# Patient Record
Sex: Female | Born: 1975
Health system: Southern US, Community
[De-identification: ages and names within clinical notes are randomized; demographics above are authoritative.]

## PROBLEM LIST (undated history)

## (undated) DIAGNOSIS — R519 Headache, unspecified: Secondary | ICD-10-CM

## (undated) DIAGNOSIS — E785 Hyperlipidemia, unspecified: Secondary | ICD-10-CM

## (undated) DIAGNOSIS — I10 Essential (primary) hypertension: Secondary | ICD-10-CM

## (undated) DIAGNOSIS — F329 Major depressive disorder, single episode, unspecified: Secondary | ICD-10-CM

## (undated) DIAGNOSIS — F419 Anxiety disorder, unspecified: Secondary | ICD-10-CM

## (undated) DIAGNOSIS — G4730 Sleep apnea, unspecified: Secondary | ICD-10-CM

## (undated) DIAGNOSIS — K219 Gastro-esophageal reflux disease without esophagitis: Secondary | ICD-10-CM

## (undated) DIAGNOSIS — M7918 Myalgia, other site: Secondary | ICD-10-CM

## (undated) DIAGNOSIS — F32A Depression, unspecified: Secondary | ICD-10-CM

## (undated) HISTORY — PX: CHOLECYSTECTOMY: SHX55

## (undated) HISTORY — DX: Essential (primary) hypertension: I10

## (undated) HISTORY — PX: TUBAL LIGATION: SHX77

## (undated) HISTORY — DX: Sleep apnea, unspecified: G47.30

---

## 2006-07-25 ENCOUNTER — Observation Stay: Payer: Self-pay | Admitting: Obstetrics and Gynecology

## 2006-08-29 ENCOUNTER — Ambulatory Visit: Payer: Self-pay

## 2006-09-13 ENCOUNTER — Observation Stay: Payer: Self-pay

## 2006-10-06 ENCOUNTER — Observation Stay: Payer: Self-pay

## 2006-10-27 ENCOUNTER — Ambulatory Visit: Payer: Self-pay

## 2006-10-28 ENCOUNTER — Inpatient Hospital Stay: Payer: Self-pay

## 2014-07-28 ENCOUNTER — Emergency Department
Admit: 2014-07-28 | Disposition: A | Discharge status: Home / Self Care | Payer: Self-pay | Admitting: Emergency Medicine

## 2015-08-28 ENCOUNTER — Emergency Department
Admission: EM | Admit: 2015-08-28 | Discharge: 2015-08-28 | Disposition: A | Discharge status: Home / Self Care | Payer: No Typology Code available for payment source | Attending: Emergency Medicine | Admitting: Emergency Medicine

## 2015-08-28 ENCOUNTER — Emergency Department: Payer: No Typology Code available for payment source

## 2015-08-28 ENCOUNTER — Encounter: Payer: Self-pay | Admitting: Intensive Care

## 2015-08-28 DIAGNOSIS — Y9389 Activity, other specified: Secondary | ICD-10-CM | POA: Diagnosis not present

## 2015-08-28 DIAGNOSIS — T148XXA Other injury of unspecified body region, initial encounter: Secondary | ICD-10-CM

## 2015-08-28 DIAGNOSIS — Y92410 Unspecified street and highway as the place of occurrence of the external cause: Secondary | ICD-10-CM | POA: Diagnosis not present

## 2015-08-28 DIAGNOSIS — Y999 Unspecified external cause status: Secondary | ICD-10-CM | POA: Diagnosis not present

## 2015-08-28 DIAGNOSIS — F329 Major depressive disorder, single episode, unspecified: Secondary | ICD-10-CM | POA: Diagnosis not present

## 2015-08-28 DIAGNOSIS — S1093XA Contusion of unspecified part of neck, initial encounter: Secondary | ICD-10-CM | POA: Diagnosis not present

## 2015-08-28 DIAGNOSIS — E785 Hyperlipidemia, unspecified: Secondary | ICD-10-CM | POA: Diagnosis not present

## 2015-08-28 DIAGNOSIS — S169XXA Unspecified injury of muscle, fascia and tendon at neck level, initial encounter: Secondary | ICD-10-CM | POA: Diagnosis present

## 2015-08-28 HISTORY — DX: Hyperlipidemia, unspecified: E78.5

## 2015-08-28 HISTORY — DX: Myalgia, other site: M79.18

## 2015-08-28 HISTORY — DX: Gastro-esophageal reflux disease without esophagitis: K21.9

## 2015-08-28 HISTORY — DX: Anxiety disorder, unspecified: F41.9

## 2015-08-28 HISTORY — DX: Major depressive disorder, single episode, unspecified: F32.9

## 2015-08-28 HISTORY — DX: Depression, unspecified: F32.A

## 2015-08-28 LAB — COMPREHENSIVE METABOLIC PANEL
ALT: 23 U/L (ref 14–54)
AST: 22 U/L (ref 15–41)
Albumin: 3.9 g/dL (ref 3.5–5.0)
Alkaline Phosphatase: 81 U/L (ref 38–126)
Anion gap: 6 (ref 5–15)
BUN: 12 mg/dL (ref 6–20)
CO2: 27 mmol/L (ref 22–32)
Calcium: 8.9 mg/dL (ref 8.9–10.3)
Chloride: 105 mmol/L (ref 101–111)
Creatinine, Ser: 0.85 mg/dL (ref 0.44–1.00)
GFR calc Af Amer: >60 mL/min (ref >60)
GFR calc non Af Amer: >60 mL/min (ref >60)
Glucose, Bld: 104 mg/dL — ABNORMAL HIGH (ref 65–99)
Potassium: 4.5 mmol/L (ref 3.5–5.1)
Sodium: 138 mmol/L (ref 135–145)
Total Bilirubin: <0.1 mg/dL — ABNORMAL LOW (ref 0.3–1.2)
Total Protein: 7.4 g/dL (ref 6.5–8.1)

## 2015-08-28 LAB — CBC WITH DIFFERENTIAL/PLATELET
BASOS ABS: 0 10*3/uL (ref 0–0.1)
Basophils Relative: 1 %
EOS PCT: 3 %
Eosinophils Absolute: 0.2 10*3/uL (ref 0–0.7)
HCT: 36.9 % (ref 35.0–47.0)
Hemoglobin: 12.3 g/dL (ref 12.0–16.0)
LYMPHS PCT: 28 %
Lymphs Abs: 1.7 10*3/uL (ref 1.0–3.6)
MCH: 27.9 pg (ref 26.0–34.0)
MCHC: 33.4 g/dL (ref 32.0–36.0)
MCV: 83.5 fL (ref 80.0–100.0)
Monocytes Absolute: 0.4 10*3/uL (ref 0.2–0.9)
Monocytes Relative: 6 %
NEUTROS ABS: 3.9 10*3/uL (ref 1.4–6.5)
Neutrophils Relative %: 62 %
PLATELETS: 232 10*3/uL (ref 150–440)
RBC: 4.42 MIL/uL (ref 3.80–5.20)
RDW: 13.7 % (ref 11.5–14.5)
WBC: 6.2 10*3/uL (ref 3.6–11.0)

## 2015-08-28 LAB — LIPASE, BLOOD: LIPASE: 27 U/L (ref 11–51)

## 2015-08-28 MED ORDER — OXYCODONE-ACETAMINOPHEN 5-325 MG PO TABS
1.0000 | ORAL_TABLET | Freq: Once | ORAL | Status: AC
Start: 2015-08-28 — End: 2015-08-28
  Administered 2015-08-28: 1 via ORAL

## 2015-08-28 MED ORDER — OXYCODONE-ACETAMINOPHEN 5-325 MG PO TABS
ORAL_TABLET | ORAL | Status: AC
  Administered 2015-08-28: 1 via ORAL
  Filled 2015-08-28: qty 1

## 2015-08-28 MED ORDER — IOPAMIDOL (ISOVUE-300) INJECTION 61%
100.0000 mL | Freq: Once | INTRAVENOUS | Status: AC | PRN
  Administered 2015-08-28: 100 mL via INTRAVENOUS

## 2015-08-28 MED ORDER — SODIUM CHLORIDE 0.9 % IV BOLUS (SEPSIS)
1000.0000 mL | Freq: Once | INTRAVENOUS | Status: AC
  Administered 2015-08-28: 1000 mL via INTRAVENOUS

## 2015-08-28 NOTE — Discharge Instructions (Signed)
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
## 2019-02-05 ENCOUNTER — Ambulatory Visit (INDEPENDENT_AMBULATORY_CARE_PROVIDER_SITE_OTHER): Payer: 59 | Admitting: Physician Assistant

## 2019-02-05 ENCOUNTER — Other Ambulatory Visit: Payer: Self-pay

## 2019-02-05 ENCOUNTER — Encounter: Payer: Self-pay | Admitting: Physician Assistant

## 2019-02-05 VITALS — BP 137/89 | HR 85 | Ht 67.0 in | Wt 264.0 lb

## 2019-02-05 DIAGNOSIS — G4440 Drug-induced headache, not elsewhere classified, not intractable: Secondary | ICD-10-CM | POA: Diagnosis not present

## 2019-02-05 DIAGNOSIS — G43009 Migraine without aura, not intractable, without status migrainosus: Secondary | ICD-10-CM

## 2019-02-05 MED ORDER — CYCLOBENZAPRINE HCL 10 MG PO TABS: 10.0000 mg | ORAL_TABLET | Freq: Three times a day (TID) | ORAL | 2 refills | Status: DC | PRN

## 2019-02-05 MED ORDER — METOCLOPRAMIDE HCL 10 MG PO TABS: 10.0000 mg | ORAL_TABLET | Freq: Three times a day (TID) | ORAL | 1 refills | Status: DC | PRN

## 2019-02-05 MED ORDER — TOPIRAMATE 25 MG PO TABS: 75.0000 mg | ORAL_TABLET | Freq: Every day | ORAL | 3 refills | Status: DC

## 2019-02-05 MED ORDER — UBRELVY 50 MG PO TABS: 50.0000 mg | ORAL_TABLET | ORAL | 3 refills | Status: DC | PRN

## 2019-02-05 NOTE — Patient Instructions (Signed)
Migraine Headache A migraine headache is a very strong throbbing pain on one side or both sides of your head. This type of headache can also cause other symptoms. It can last from 4 hours to 3 days. Talk with your doctor about what things may bring on (trigger) this condition. What are the causes? The exact cause of this condition is not known. This condition may be triggered or caused by:  Drinking alcohol.  Smoking.  Taking medicines, such as: ? Medicine used to treat chest pain (nitroglycerin). ? Birth control pills. ? Estrogen. ? Some blood pressure medicines.  Eating or drinking certain products.  Doing physical activity. Other things that may trigger a migraine headache include:  Having a menstrual period.  Pregnancy.  Hunger.  Stress.  Not getting enough sleep or getting too much sleep.  Weather changes.  Tiredness (fatigue). What increases the risk?  Being 25-55 years old.  Being female.  Having a family history of migraine headaches.  Being Caucasian.  Having depression or anxiety.  Being very overweight. What are the signs or symptoms?  A throbbing pain. This pain may: ? Happen in any area of the head, such as on one side or both sides. ? Make it hard to do daily activities. ? Get worse with physical activity. ? Get worse around bright lights or loud noises.  Other symptoms may include: ? Feeling sick to your stomach (nauseous). ? Vomiting. ? Dizziness. ? Being sensitive to bright lights, loud noises, or smells.  Before you get a migraine headache, you may get warning signs (an aura). An aura may include: ? Seeing flashing lights or having blind spots. ? Seeing bright spots, halos, or zigzag lines. ? Having tunnel vision or blurred vision. ? Having numbness or a tingling feeling. ? Having trouble talking. ? Having weak muscles.  Some people have symptoms after a migraine headache (postdromal phase), such as: ? Tiredness. ? Trouble  thinking (concentrating). How is this treated?  Taking medicines that: ? Relieve pain. ? Relieve the feeling of being sick to your stomach. ? Prevent migraine headaches.  Treatment may also include: ? Having acupuncture. ? Avoiding foods that bring on migraine headaches. ? Learning ways to control your body functions (biofeedback). ? Therapy to help you know and deal with negative thoughts (cognitive behavioral therapy). Follow these instructions at home: Medicines  Take over-the-counter and prescription medicines only as told by your doctor.  Ask your doctor if the medicine prescribed to you: ? Requires you to avoid driving or using heavy machinery. ? Can cause trouble pooping (constipation). You may need to take these steps to prevent or treat trouble pooping:  Drink enough fluid to keep your pee (urine) pale yellow.  Take over-the-counter or prescription medicines.  Eat foods that are high in fiber. These include beans, whole grains, and fresh fruits and vegetables.  Limit foods that are high in fat and sugar. These include fried or sweet foods. Lifestyle  Do not drink alcohol.  Do not use any products that contain nicotine or tobacco, such as cigarettes, e-cigarettes, and chewing tobacco. If you need help quitting, ask your doctor.  Get at least 8 hours of sleep every night.  Limit and deal with stress. General instructions      Keep a journal to find out what may bring on your migraine headaches. For example, write down: ? What you eat and drink. ? How much sleep you get. ? Any change in what you eat or drink. ? Any   change in your medicines.  If you have a migraine headache: ? Avoid things that make your symptoms worse, such as bright lights. ? It may help to lie down in a dark, quiet room. ? Do not drive or use heavy machinery. ? Ask your doctor what activities are safe for you.  Keep all follow-up visits as told by your doctor. This is important. Contact  a doctor if:  You get a migraine headache that is different or worse than others you have had.  You have more than 15 headache days in one month. Get help right away if:  Your migraine headache gets very bad.  Your migraine headache lasts longer than 72 hours.  You have a fever.  You have a stiff neck.  You have trouble seeing.  Your muscles feel weak or like you cannot control them.  You start to lose your balance a lot.  You start to have trouble walking.  You pass out (faint).  You have a seizure. Summary  A migraine headache is a very strong throbbing pain on one side or both sides of your head. These headaches can also cause other symptoms.  This condition may be treated with medicines and changes to your lifestyle.  Keep a journal to find out what may bring on your migraine headaches.  Contact a doctor if you get a migraine headache that is different or worse than others you have had.  Contact your doctor if you have more than 15 headache days in a month. This information is not intended to replace advice given to you by your health care provider. Make sure you discuss any questions you have with your health care provider. Document Released: 01/16/2008 Document Revised: 07/31/2018 Document Reviewed: 05/21/2018 Elsevier Patient Education  2020 [employer].   Analgesic Rebound Headache An analgesic rebound headache, sometimes called a medication overuse headache, is a headache that comes after pain medicine (analgesic) taken to treat the original (primary) headache has worn off. Any type of primary headache can return as a rebound headache if a person regularly takes analgesics more than three times a week to treat it. The types of primary headaches that are commonly associated with rebound headaches include:  Migraines.  Headaches that arise from tense muscles in the head and neck area (tension headaches).  Headaches that develop and happen again (recur) on one  side of the head and around the eye (cluster headaches). If rebound headaches continue, they become chronic daily headaches. What are the causes? This condition may be caused by frequent use of:  Over-the-counter medicines such as aspirin, ibuprofen, and acetaminophen.  Sinus relief medicines and other medicines that contain caffeine.  Narcotic pain medicines such as codeine and oxycodone. What are the signs or symptoms? The symptoms of a rebound headache are the same as the symptoms of the original headache. Some of the symptoms of specific types of headaches include: Migraine headache  Pulsing or throbbing pain on one or both sides of the head.  Severe pain that interferes with daily activities.  Pain that is worsened by physical activity.  Nausea, vomiting, or both.  Pain with exposure to bright light, loud noises, or strong smells.  General sensitivity to bright light, loud noises, or strong smells.  Visual changes.  Numbness of one or both arms. Tension headache  Pressure around the head.  Dull, aching head pain.  Pain felt over the front and sides of the head.  Tenderness in the muscles of the head, neck,  and shoulders. Cluster headache  Severe pain that begins in or around one eye or temple.  Redness and tearing in the eye on the same side as the pain.  Droopy or swollen eyelid.  One-sided head pain.  Nausea.  Runny nose.  Sweaty, pale facial skin.  Restlessness. How is this diagnosed? This condition is diagnosed by:  Reviewing your medical history. This includes the nature of your primary headaches.  Reviewing the types of pain medicines that you have been using to treat your headaches and how often you take them. How is this treated? This condition may be treated or managed by:  Discontinuing frequent use of the analgesic medicine. Doing this may worsen your headaches at first, but the pain should eventually become more manageable, less  frequent, and less severe.  Seeing a headache specialist. He or she may be able to help you manage your headaches and help make sure there is not another cause of the headaches.  Using methods of stress relief, such as acupuncture, counseling, biofeedback, and massage. Talk with your health care provider about which methods might be good for you. Follow these instructions at home:  Take over-the-counter and prescription medicines only as told by your health care provider.  Stop the repeated use of pain medicine as told by your health care provider. Stopping can be difficult. Carefully follow instructions from your health care provider.  Avoid triggers that are known to cause your primary headaches.  Keep all follow-up visits as told by your health care provider. This is important. Contact a health care provider if:  You continue to experience headaches after following treatments that your health care provider recommended. Get help right away if:  You develop new headache pain.  You develop headache pain that is different than what you have experienced in the past.  You develop numbness or tingling in your arms or legs.  You develop changes in your speech or vision. This information is not intended to replace advice given to you by your health care provider. Make sure you discuss any questions you have with your health care provider. Document Released: 06/29/2003 Document Revised: 03/21/2017 Document Reviewed: 09/11/2015 Elsevier Patient Education  2020 [employer].

## 2019-02-05 NOTE — Progress Notes (Signed)
History:  Kelly [name] is a 43 y.o.  who presents to clinic today for new evaluation of headache.   She has been getting headaches over 20 years.  The headaches get to be severe.  They are located right side - forehead/eye/temple.  This week she also had sharp shooting pain into her jaw.  There is throbbing.  Movement makes it worse as well as lights.  Noises are less an issue.  Some smells cause issues.  There is nausea, dizziness.   There is pressure in the eye area first.  Last week, it lasted 6 days straight, normally it is less.   She has tried excedrin, hydroxyzine, tylenol, caffeine, ibuprofen, aleve, cold compress, rest.   Her periods are irregular, which is new for her. 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 She drinks 6 sodas (Pepsi) daily.  HIT6:62 Number of days in the last 4 weeks with:  Severe headache: 3 Moderate headache: 1 Mild headache: 6  No headache: 18   Past Medical History:  Diagnosis Date  . Anxiety   . Depression   . GERD (gastroesophageal reflux disease)   . Hyperlipemia   . Myofascial pain syndrome     Social History   Socioeconomic History  . Marital status: Married    Spouse name: Not on file  . Number of children: Not on file  . Years of education: Not on file  . Highest education level: Not on file  Occupational History  . Not on file  Social Needs  . Financial resource strain: Not on file  . Food insecurity    Worry: Not on file    Inability: Not on file  . Transportation needs    Medical: Not on file    Non-medical: Not on file  Tobacco Use  . Smoking status: Never Smoker  . Smokeless tobacco: Never Used  Substance and Sexual Activity  . Alcohol use: No  . Drug use: No  . Sexual activity: Not on file  Lifestyle  . Physical activity    Days per week: Not on file    Minutes per session: Not on file  . Stress: Not on file  Relationships  . Social  Herbalist on phone: Not on file    Gets together: Not on file    Attends religious service: Not on file    Active member of club or organization: Not on file    Attends meetings of clubs or organizations: Not on file    Relationship status: Not on file  . Intimate partner violence    Fear of current or ex partner: Not on file    Emotionally abused: Not on file    Physically abused: Not on file    Forced sexual activity: Not on file  Other Topics Concern  . Not on file  Social History Narrative  . Not on file    History reviewed. No pertinent family history.  No Known Allergies  Current Outpatient Medications on File Prior to Visit  Medication Sig Dispense Refill  . atorvastatin (LIPITOR) 40 MG tablet Take 40 mg by mouth daily.    Marland Kitchen buPROPion (WELLBUTRIN SR) 150 MG 12 hr tablet Take 150 mg by mouth 2 (two) times daily.    Marland Kitchen escitalopram (LEXAPRO) 20 MG tablet Take 20 mg by mouth daily.    . hydrOXYzine (VISTARIL) 25 MG  capsule Take 25 mg by mouth 3 (three) times daily as needed.    Marland Kitchen omeprazole (PRILOSEC) 20 MG capsule Take 20 mg by mouth daily.     No current facility-administered medications on file prior to visit.      Review of Systems:  All pertinent positive/negative included in HPI, all other review of systems are negative   Objective:  Physical Exam BP 137/89   Pulse 85   Ht 5\' 7"  (1.702 m)   Wt 264 lb (119.7 kg)   BMI 41.35 kg/m  CONSTITUTIONAL: Well-developed, well-nourished female in no acute distress.  EYES: EOM intact ENT: Normocephalic CARDIOVASCULAR: Regular rate  RESPIRATORY: Normal rate. MUSCULOSKELETAL: Normal ROM SKIN: Warm, dry without erythema  NEUROLOGICAL: Alert, oriented, CN II-XII grossly intact, Appropriate balance PSYCH: Normal behavior, mood   Assessment & Plan:  Assessment: Migraine - without aura, new diagnosis Medication overuse headache  Plan: Try to cut out Pepsi/all soda Begin Topamax for migraine prevention -  start with 25mg  daily and titrate to 75mg  daily as tolerated.   Exercise - am - outside Yoga/meditation/massage - all recommended Flexeril - for HA prn  - sedation precautions Ubrelvy for migraine - sample and info provided Reglan for HA/Nausea Detailed instructions written for pt.   Follow-up in 3 months or sooner PRN  , PA-C 02/05/2019 10:18 AM

## 2019-02-10 ENCOUNTER — Encounter: Payer: Self-pay | Admitting: *Deleted

## 2019-04-30 ENCOUNTER — Other Ambulatory Visit: Payer: Self-pay

## 2019-04-30 ENCOUNTER — Ambulatory Visit (INDEPENDENT_AMBULATORY_CARE_PROVIDER_SITE_OTHER): Payer: 59 | Admitting: Physician Assistant

## 2019-04-30 ENCOUNTER — Encounter: Payer: Self-pay | Admitting: Physician Assistant

## 2019-04-30 VITALS — BP 146/90 | HR 99 | Wt 268.0 lb

## 2019-04-30 DIAGNOSIS — G43709 Chronic migraine without aura, not intractable, without status migrainosus: Secondary | ICD-10-CM | POA: Diagnosis not present

## 2019-04-30 DIAGNOSIS — G43009 Migraine without aura, not intractable, without status migrainosus: Secondary | ICD-10-CM

## 2019-04-30 MED ORDER — NURTEC 75 MG PO TBDP: 75.0000 mg | ORAL_TABLET | ORAL | 6 refills | Status: DC | PRN

## 2019-04-30 MED ORDER — TOPIRAMATE 100 MG PO TABS: 100.0000 mg | ORAL_TABLET | Freq: Two times a day (BID) | ORAL | 3 refills | Status: DC

## 2019-04-30 MED ORDER — METOPROLOL TARTRATE 25 MG PO TABS: 25.0000 mg | ORAL_TABLET | Freq: Two times a day (BID) | ORAL | 3 refills | Status: DC

## 2019-04-30 NOTE — Patient Instructions (Signed)

## 2019-04-30 NOTE — Progress Notes (Signed)
History:  Kelly [name] is a 44 y.o. who presents to clinic today for headache management.  In the beginning the topamax helped to ease the frequency of the migraine.  She is using 3/day without side effects.  She currently notices no improvement whatsoever and is having headaches every single day, often severe.   Stress has increased tremendously at work.  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   Bernita Raisin was helpful but takes an hour and a half to notice any improvement.    HIT6:66 Number of days in the last 4 weeks with:  Severe headache: 7 Moderate headache: 14 Mild headache: 7  No headache: 0   Past Medical History:  Diagnosis Date  . Anxiety   . Depression   . GERD (gastroesophageal reflux disease)   . Hyperlipemia   . Myofascial pain syndrome     Social History   Socioeconomic History  . Marital status: Single    Spouse name: Not on file  . Number of children: Not on file  . Years of education: Not on file  . Highest education level: Not on file  Occupational History  . Not on file  Tobacco Use  . Smoking status: Never Smoker  . Smokeless tobacco: Never Used  Substance and Sexual Activity  . Alcohol use: No  . Drug use: No  . Sexual activity: Not on file  Other Topics Concern  . Not on file  Social History Narrative  . Not on file   Social Determinants of Health   Financial Resource Strain:   . Difficulty of Paying Living Expenses: Not on file  Food Insecurity:   . Worried About Programme researcher, broadcasting/film/video in the Last Year: Not on file  . Ran Out of Food in the Last Year: Not on file  Transportation Needs:   . Lack of Transportation (Medical): Not on file  . Lack of Transportation (Non-Medical): Not on file  Physical Activity:   . Days of Exercise per Week: Not on file  . Minutes of Exercise per Session: Not on file  Stress:   . Feeling of Stress : Not on file  Social Connections:   . Frequency of Communication with Friends and Family: Not on  file  . Frequency of Social Gatherings with Friends and Family: Not on file  . Attends Religious Services: Not on file  . Active Member of Clubs or Organizations: Not on file  . Attends Banker Meetings: Not on file  . Marital Status: Not on file  Intimate Partner Violence:   . Fear of Current or Ex-Partner: Not on file  . Emotionally Abused: Not on file  . Physically Abused: Not on file  . Sexually Abused: Not on file    History reviewed. No pertinent family history.  No Known Allergies  Current Outpatient Medications on File Prior to Visit  Medication Sig Dispense Refill  . atorvastatin (LIPITOR) 40 MG tablet Take 40 mg by mouth daily.    Marland Kitchen buPROPion (WELLBUTRIN SR) 150 MG 12 hr tablet Take 150 mg by mouth 2 (two) times daily.    . cyclobenzaprine (FLEXERIL) 10 MG tablet Take 1 tablet (10 mg total) by mouth 3 (three) times daily as needed for muscle spasms. 40 tablet 2  . escitalopram (LEXAPRO) 20 MG tablet Take 20 mg by mouth daily.    . hydrOXYzine (VISTARIL) 25 MG capsule Take 25 mg by mouth 3 (three) times daily as needed.    . metoCLOPramide (  REGLAN) 10 MG tablet Take 1 tablet (10 mg total) by mouth 3 (three) times daily as needed for nausea. 30 tablet 1  . omeprazole (PRILOSEC) 20 MG capsule Take 20 mg by mouth daily.    Marland Kitchen topiramate (TOPAMAX) 25 MG tablet Take 3 tablets (75 mg total) by mouth daily. 90 tablet 3  . Ubrogepant (UBRELVY) 50 MG TABS Take 50 mg by mouth as needed. 10 tablet 3   No current facility-administered medications on file prior to visit.     Review of Systems:  All pertinent positive/negative included in HPI, all other review of systems are negative   Objective:  Physical Exam BP (!) 146/90   Pulse 99   Wt 268 lb (121.6 kg)   BMI 41.97 kg/m  CONSTITUTIONAL: Well-developed, well-nourished female in no acute distress.  EYES: EOM intact ENT: Normocephalic CARDIOVASCULAR: Regular rate  RESPIRATORY: Normal rate.  MUSCULOSKELETAL:  Normal ROM SKIN: Warm, dry without erythema  NEUROLOGICAL: Alert, oriented, CN II-XII grossly intact, Appropriate balance PSYCH: Normal behavior, mood   Assessment & Plan:  Assessment: Insufficient improvement in migraine Chronic Migraine - new problem  Plan: Increase Topamax as tolerated up to 200mg  daily Begin Metoprolol for migraine prevention as well - 25mg  daily and may increase to 2/day as tolerated and as needed Will trial Nurtec in place of Ubrelvy in hopes of faster efficacy Follow-up in 2 months or sooner PRN  Jaclyn Prime, Collene Leyden, PA-C 04/30/2019 10:09 AM

## 2019-05-10 ENCOUNTER — Encounter: Payer: Self-pay | Admitting: *Deleted

## 2019-10-20 ENCOUNTER — Encounter: Payer: Self-pay | Admitting: *Deleted

## 2019-10-26 ENCOUNTER — Other Ambulatory Visit: Payer: Self-pay | Admitting: Physician Assistant

## 2019-11-03 ENCOUNTER — Other Ambulatory Visit: Payer: Self-pay

## 2019-11-03 ENCOUNTER — Other Ambulatory Visit: Payer: Self-pay | Admitting: Physician Assistant

## 2019-11-03 NOTE — Telephone Encounter (Signed)
Patient is requesting a refill on lopressor and topamax

## 2019-11-10 DIAGNOSIS — F331 Major depressive disorder, recurrent, moderate: Secondary | ICD-10-CM | POA: Diagnosis not present

## 2019-11-10 DIAGNOSIS — F411 Generalized anxiety disorder: Secondary | ICD-10-CM | POA: Diagnosis not present

## 2019-11-24 DIAGNOSIS — F4011 Social phobia, generalized: Secondary | ICD-10-CM | POA: Diagnosis not present

## 2019-11-24 DIAGNOSIS — F331 Major depressive disorder, recurrent, moderate: Secondary | ICD-10-CM | POA: Diagnosis not present

## 2019-11-24 DIAGNOSIS — F411 Generalized anxiety disorder: Secondary | ICD-10-CM | POA: Diagnosis not present

## 2019-12-08 DIAGNOSIS — F411 Generalized anxiety disorder: Secondary | ICD-10-CM | POA: Diagnosis not present

## 2019-12-15 DIAGNOSIS — F331 Major depressive disorder, recurrent, moderate: Secondary | ICD-10-CM | POA: Diagnosis not present

## 2019-12-15 DIAGNOSIS — F411 Generalized anxiety disorder: Secondary | ICD-10-CM | POA: Diagnosis not present

## 2019-12-25 DIAGNOSIS — F411 Generalized anxiety disorder: Secondary | ICD-10-CM | POA: Diagnosis not present

## 2020-01-05 DIAGNOSIS — F411 Generalized anxiety disorder: Secondary | ICD-10-CM | POA: Diagnosis not present

## 2020-01-12 DIAGNOSIS — Z124 Encounter for screening for malignant neoplasm of cervix: Secondary | ICD-10-CM | POA: Diagnosis not present

## 2020-01-12 DIAGNOSIS — R7989 Other specified abnormal findings of blood chemistry: Secondary | ICD-10-CM | POA: Diagnosis not present

## 2020-01-12 DIAGNOSIS — Z1389 Encounter for screening for other disorder: Secondary | ICD-10-CM | POA: Diagnosis not present

## 2020-01-12 DIAGNOSIS — R7309 Other abnormal glucose: Secondary | ICD-10-CM | POA: Diagnosis not present

## 2020-01-12 DIAGNOSIS — F339 Major depressive disorder, recurrent, unspecified: Secondary | ICD-10-CM | POA: Diagnosis not present

## 2020-01-12 DIAGNOSIS — Z1159 Encounter for screening for other viral diseases: Secondary | ICD-10-CM | POA: Diagnosis not present

## 2020-01-12 DIAGNOSIS — E559 Vitamin D deficiency, unspecified: Secondary | ICD-10-CM | POA: Diagnosis not present

## 2020-01-12 DIAGNOSIS — I10 Essential (primary) hypertension: Secondary | ICD-10-CM | POA: Diagnosis not present

## 2020-01-12 DIAGNOSIS — Z113 Encounter for screening for infections with a predominantly sexual mode of transmission: Secondary | ICD-10-CM | POA: Diagnosis not present

## 2020-01-12 DIAGNOSIS — E785 Hyperlipidemia, unspecified: Secondary | ICD-10-CM | POA: Diagnosis not present

## 2020-01-12 DIAGNOSIS — F419 Anxiety disorder, unspecified: Secondary | ICD-10-CM | POA: Diagnosis not present

## 2020-01-13 DIAGNOSIS — F331 Major depressive disorder, recurrent, moderate: Secondary | ICD-10-CM | POA: Diagnosis not present

## 2020-01-13 DIAGNOSIS — F411 Generalized anxiety disorder: Secondary | ICD-10-CM | POA: Diagnosis not present

## 2020-01-19 DIAGNOSIS — F411 Generalized anxiety disorder: Secondary | ICD-10-CM | POA: Diagnosis not present

## 2020-01-26 DIAGNOSIS — F411 Generalized anxiety disorder: Secondary | ICD-10-CM | POA: Diagnosis not present

## 2020-01-26 DIAGNOSIS — F331 Major depressive disorder, recurrent, moderate: Secondary | ICD-10-CM | POA: Diagnosis not present

## 2020-02-02 DIAGNOSIS — F4011 Social phobia, generalized: Secondary | ICD-10-CM | POA: Diagnosis not present

## 2020-02-02 DIAGNOSIS — F411 Generalized anxiety disorder: Secondary | ICD-10-CM | POA: Diagnosis not present

## 2020-02-02 DIAGNOSIS — F331 Major depressive disorder, recurrent, moderate: Secondary | ICD-10-CM | POA: Diagnosis not present

## 2020-02-09 DIAGNOSIS — F411 Generalized anxiety disorder: Secondary | ICD-10-CM | POA: Diagnosis not present

## 2020-03-07 DIAGNOSIS — Z1389 Encounter for screening for other disorder: Secondary | ICD-10-CM | POA: Diagnosis not present

## 2020-03-07 DIAGNOSIS — F331 Major depressive disorder, recurrent, moderate: Secondary | ICD-10-CM | POA: Diagnosis not present

## 2020-03-07 DIAGNOSIS — F339 Major depressive disorder, recurrent, unspecified: Secondary | ICD-10-CM | POA: Diagnosis not present

## 2020-03-07 DIAGNOSIS — F419 Anxiety disorder, unspecified: Secondary | ICD-10-CM | POA: Diagnosis not present

## 2020-03-08 DIAGNOSIS — F411 Generalized anxiety disorder: Secondary | ICD-10-CM | POA: Diagnosis not present

## 2020-03-22 DIAGNOSIS — F411 Generalized anxiety disorder: Secondary | ICD-10-CM | POA: Diagnosis not present

## 2020-03-30 ENCOUNTER — Other Ambulatory Visit: Payer: Self-pay | Admitting: Physician Assistant

## 2020-04-05 DIAGNOSIS — F411 Generalized anxiety disorder: Secondary | ICD-10-CM | POA: Diagnosis not present

## 2020-04-07 ENCOUNTER — Other Ambulatory Visit: Payer: Self-pay | Admitting: Physician Assistant

## 2020-04-07 ENCOUNTER — Encounter: Payer: Self-pay | Admitting: *Deleted

## 2020-04-27 DIAGNOSIS — F411 Generalized anxiety disorder: Secondary | ICD-10-CM | POA: Diagnosis not present

## 2020-04-28 ENCOUNTER — Other Ambulatory Visit: Payer: Self-pay | Admitting: Oral Surgery

## 2020-05-18 DIAGNOSIS — F411 Generalized anxiety disorder: Secondary | ICD-10-CM | POA: Diagnosis not present

## 2020-05-19 DIAGNOSIS — Z20822 Contact with and (suspected) exposure to covid-19: Secondary | ICD-10-CM | POA: Diagnosis not present

## 2020-05-29 DIAGNOSIS — F411 Generalized anxiety disorder: Secondary | ICD-10-CM | POA: Diagnosis not present

## 2020-06-02 DIAGNOSIS — U071 COVID-19: Secondary | ICD-10-CM | POA: Diagnosis not present

## 2020-06-02 DIAGNOSIS — Z20822 Contact with and (suspected) exposure to covid-19: Secondary | ICD-10-CM | POA: Diagnosis not present

## 2020-06-12 DIAGNOSIS — F411 Generalized anxiety disorder: Secondary | ICD-10-CM | POA: Diagnosis not present

## 2020-06-16 ENCOUNTER — Encounter: Payer: 59 | Admitting: Physician Assistant

## 2020-06-21 DIAGNOSIS — Z79899 Other long term (current) drug therapy: Secondary | ICD-10-CM | POA: Diagnosis not present

## 2020-06-21 DIAGNOSIS — Z1389 Encounter for screening for other disorder: Secondary | ICD-10-CM | POA: Diagnosis not present

## 2020-06-21 DIAGNOSIS — F411 Generalized anxiety disorder: Secondary | ICD-10-CM | POA: Diagnosis not present

## 2020-06-21 DIAGNOSIS — F331 Major depressive disorder, recurrent, moderate: Secondary | ICD-10-CM | POA: Diagnosis not present

## 2020-06-21 DIAGNOSIS — F4312 Post-traumatic stress disorder, chronic: Secondary | ICD-10-CM | POA: Diagnosis not present

## 2020-06-26 DIAGNOSIS — F411 Generalized anxiety disorder: Secondary | ICD-10-CM | POA: Diagnosis not present

## 2020-07-10 DIAGNOSIS — F411 Generalized anxiety disorder: Secondary | ICD-10-CM | POA: Diagnosis not present

## 2020-07-19 DIAGNOSIS — F411 Generalized anxiety disorder: Secondary | ICD-10-CM | POA: Diagnosis not present

## 2020-07-19 DIAGNOSIS — F4312 Post-traumatic stress disorder, chronic: Secondary | ICD-10-CM | POA: Diagnosis not present

## 2020-07-19 DIAGNOSIS — F331 Major depressive disorder, recurrent, moderate: Secondary | ICD-10-CM | POA: Diagnosis not present

## 2020-07-24 DIAGNOSIS — F411 Generalized anxiety disorder: Secondary | ICD-10-CM | POA: Diagnosis not present

## 2020-07-28 ENCOUNTER — Encounter: Payer: Self-pay | Admitting: Physician Assistant

## 2020-07-28 ENCOUNTER — Other Ambulatory Visit: Payer: Self-pay

## 2020-07-28 ENCOUNTER — Ambulatory Visit (INDEPENDENT_AMBULATORY_CARE_PROVIDER_SITE_OTHER): Payer: 59 | Admitting: Physician Assistant

## 2020-07-28 VITALS — BP 127/84 | HR 63 | Wt 279.0 lb

## 2020-07-28 DIAGNOSIS — R0683 Snoring: Secondary | ICD-10-CM

## 2020-07-28 DIAGNOSIS — G43009 Migraine without aura, not intractable, without status migrainosus: Secondary | ICD-10-CM

## 2020-07-28 MED ORDER — METOPROLOL TARTRATE 25 MG PO TABS
25.0000 mg | ORAL_TABLET | Freq: Two times a day (BID) | ORAL | 11 refills | Status: DC
  Filled 2020-07-28: qty 60, 30d supply, fill #0
  Filled 2020-09-21 – 2020-10-09 (×2): qty 60, 30d supply, fill #1
  Filled 2021-01-22: qty 60, 30d supply, fill #2

## 2020-07-28 MED ORDER — NURTEC 75 MG PO TBDP
75.0000 mg | ORAL_TABLET | ORAL | 11 refills | Status: DC | PRN
  Filled 2020-07-28: qty 8, 30d supply, fill #0
  Filled 2021-03-03: qty 8, 30d supply, fill #1
  Filled 2021-04-09: qty 8, 30d supply, fill #2
  Filled 2021-06-19: qty 8, 30d supply, fill #3
  Filled 2021-07-11: qty 8, 30d supply, fill #4

## 2020-07-28 MED ORDER — TOPIRAMATE 200 MG PO TABS
200.0000 mg | ORAL_TABLET | Freq: Every day | ORAL | 11 refills | Status: DC
  Filled 2020-07-28: qty 30, 30d supply, fill #0
  Filled 2020-09-21 – 2020-10-12 (×2): qty 30, 30d supply, fill #1
  Filled 2021-01-22: qty 30, 30d supply, fill #2
  Filled 2021-03-03: qty 30, 30d supply, fill #3
  Filled 2021-04-09: qty 30, 30d supply, fill #4
  Filled 2021-07-11: qty 30, 30d supply, fill #5
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
## 2020-07-28 NOTE — Patient Instructions (Signed)
Sleep Study, Adult A sleep study (polysomnogram) is a series of tests done while you are sleeping. A sleep study records your brain waves, heart rate, breathing rate, oxygen level, and eye and leg movements. A sleep study helps your health care provider:  See how well you sleep.  Diagnose a sleep disorder.  Determine how severe your sleep disorder is.  Create a plan to treat your sleep disorder. Your health care provider may recommend a sleep study if you:  Feel sleepy on most days.  Snore loudly while sleeping.  Have unusual behaviors while you sleep, such as walking.  Have brief periods in which you stop breathing during sleep (sleepapnea).  Fall asleep suddenly during the day (narcolepsy).  Have trouble falling asleep or staying asleep (insomnia).  Feel like you need to move your legs when trying to fall asleep (restless legs syndrome).  Move your legs by flexing and extending them regularly while asleep (periodic limb movement disorder).  Act out your dreams while you sleep (sleep behavior disorder).  Feel like you cannot move when you first wake up (sleep paralysis). What tests are part of a sleep study? Most sleep studies record the following during sleep:  Brain activity.  Eye movements.  Heart rate and rhythm.  Breathing rate and rhythm.  Blood-oxygen level.  Blood pressure.  Chest and belly movement as you breathe.  Arm and leg movements.  Snoring or other noises.  Body position. Where are sleep studies done? Sleep studies are done at sleep centers. A sleep center may be inside a hospital, office, or clinic. The room where you have the study may look like a hospital room or a hotel room. The health care providers doing the study may come in and out of the room during the study. Most of the time, they will be in another room monitoring your test as you sleep. How are sleep studies done? Most sleep studies are done during a normal period of time for a  full night of sleep. You will arrive at the study center in the evening and go home in the morning. Before the test  Bring your pajamas and toothbrush with you to the sleep study.  Do not have caffeine on the day of your sleep study.  Do not drink alcohol on the day of your sleep study.  Your health care provider will let you know if you should stop taking any of your regular medicines before the test. During the test  Round, sticky patches with sensors attached to recording wires (electrodes) are placed on your scalp, face, chest, and limbs.  Wires from all the electrodes and sensors run from your bed to a computer. The wires can be taken off and put back on if you need to get out of bed to go to the bathroom.  A sensor is placed over your nose to measure airflow.  A finger clip is put on your finger or ear to measure your blood oxygen level (pulse oximetry).  A belt is placed around your belly and a belt is placed around your chest to measure breathing movements.  If you have signs of the sleep disorder called sleep apnea during your test, you may get a treatment mask to wear for the second half of the night. ? The mask provides positive airway pressure (PAP) to help you breathe better during sleep. This may greatly improve your sleep apnea. ? You will then have all tests done again with the mask in place to  see if your measurements and recordings change.      After the test  A medical doctor who specializes in sleep will evaluate the results of your sleep study and share them with you and your primary health care provider.  Based on your results, your medical history, and a physical exam, you may be diagnosed with a sleep disorder, such as: ? Sleep apnea. ? Restless legs syndrome. ? Sleep-related behavior disorder. ? Sleep-related movement disorders. ? Sleep-related seizure disorders.  Your health care team will help determine your treatment options based on your diagnosis.  This may include: ? Improving your sleep habits (sleep hygiene). ? Wearing a continuous positive airway pressure (CPAP) or bi-level positive airway pressure (BPAP) mask. ? Wearing an oral device at night to improve breathing and reduce snoring. ? Taking medicines. Follow these instructions at home:  Take over-the-counter and prescription medicines only as told by your health care provider.  If you are instructed to use a CPAP or BPAP mask, make sure you use it nightly as directed.  Make any lifestyle changes that your health care provider recommends.  If you were given a device to open your airway while you sleep, use it only as told by your health care provider.  Do not use any tobacco products, such as cigarettes, chewing tobacco, and e-cigarettes. If you need help quitting, ask your health care provider.  Keep all follow-up visits as told by your health care provider. This is important. Summary  A sleep study (polysomnogram) is a series of tests done while you are sleeping. It shows how well you sleep.  Most sleep studies are done over one full night of sleep. You will arrive at the study center in the evening and go home in the morning.  If you have signs of the sleep disorder called sleep apnea during your test, you may get a treatment mask to wear for the second half of the night.  A medical doctor who specializes in sleep will evaluate the results of your sleep study and share them with your primary health care provider. This information is not intended to replace advice given to you by your health care provider. Make sure you discuss any questions you have with your health care provider. Document Revised: 05/14/2019 Document Reviewed: 05/06/2017 Elsevier Patient Education  2021 [employer] Migraine Headache A migraine headache is an intense, throbbing pain on one side or both sides of the head. Migraine headaches may also cause other symptoms, such as nausea, vomiting, and  sensitivity to light and noise. A migraine headache can last from 4 hours to 3 days. Talk with your doctor about what things may bring on (trigger) your migraine headaches. What are the causes? The exact cause of this condition is not known. However, a migraine may be caused when nerves in the brain become irritated and release chemicals that cause inflammation of blood vessels. This inflammation causes pain. This condition may be triggered or caused by:  Drinking alcohol.  Smoking.  Taking medicines, such as: ? Medicine used to treat chest pain (nitroglycerin). ? Birth control pills. ? Estrogen. ? Certain blood pressure medicines.  Eating or drinking products that contain nitrates, glutamate, aspartame, or tyramine. Aged cheeses, chocolate, or caffeine may also be triggers.  Doing physical activity. Other things that may trigger a migraine headache include:  Menstruation.  Pregnancy.  Hunger.  Stress.  Lack of sleep or too much sleep.  Weather changes.  Fatigue. What increases the risk? The following factors  may make you more likely to experience migraine headaches:  Being a certain age. This condition is more common in people who are 54-80 years old.  Being female.  Having a family history of migraine headaches.  Being Caucasian.  Having a mental health condition, such as depression or anxiety.  Being obese. What are the signs or symptoms? The main symptom of this condition is pulsating or throbbing pain. This pain may:  Happen in any area of the head, such as on one side or both sides.  Interfere with daily activities.  Get worse with physical activity.  Get worse with exposure to bright lights or loud noises. Other symptoms may include:  Nausea.  Vomiting.  Dizziness.  General sensitivity to bright lights, loud noises, or smells. Before you get a migraine headache, you may get warning signs (an aura). An aura may include:  Seeing flashing lights  or having blind spots.  Seeing bright spots, halos, or zigzag lines.  Having tunnel vision or blurred vision.  Having numbness or a tingling feeling.  Having trouble talking.  Having muscle weakness. Some people have symptoms after a migraine headache (postdromal phase), such as:  Feeling tired.  Difficulty concentrating. How is this diagnosed? A migraine headache can be diagnosed based on:  Your symptoms.  A physical exam.  Tests, such as: ? CT scan or an MRI of the head. These imaging tests can help rule out other causes of headaches. ? Taking fluid from the spine (lumbar puncture) and analyzing it (cerebrospinal fluid analysis, or CSF analysis). How is this treated? This condition may be treated with medicines that:  Relieve pain.  Relieve nausea.  Prevent migraine headaches. Treatment for this condition may also include:  Acupuncture.  Lifestyle changes like avoiding foods that trigger migraine headaches.  Biofeedback.  Cognitive behavioral therapy. Follow these instructions at home: Medicines  Take over-the-counter and prescription medicines only as told by your health care provider.  Ask your health care provider if the medicine prescribed to you: ? Requires you to avoid driving or using heavy machinery. ? Can cause constipation. You may need to take these actions to prevent or treat constipation:  Drink enough fluid to keep your urine pale yellow.  Take over-the-counter or prescription medicines.  Eat foods that are high in fiber, such as beans, whole grains, and fresh fruits and vegetables.  Limit foods that are high in fat and processed sugars, such as fried or sweet foods. Lifestyle  Do not drink alcohol.  Do not use any products that contain nicotine or tobacco, such as cigarettes, e-cigarettes, and chewing tobacco. If you need help quitting, ask your health care provider.  Get at least 8 hours of sleep every night.  Find ways to manage  stress, such as meditation, deep breathing, or yoga. General instructions  Keep a journal to find out what may trigger your migraine headaches. For example, write down: ? What you eat and drink. ? How much sleep you get. ? Any change to your diet or medicines.  If you have a migraine headache: ? Avoid things that make your symptoms worse, such as bright lights. ? It may help to lie down in a dark, quiet room. ? Do not drive or use heavy machinery. ? Ask your health care provider what activities are safe for you while you are experiencing symptoms.  Keep all follow-up visits as told by your health care provider. This is important.      Contact a health care provider  if:  You develop symptoms that are different or more severe than your usual migraine headache symptoms.  You have more than 15 headache days in one month. Get help right away if:  Your migraine headache becomes severe.  Your migraine headache lasts longer than 72 hours.  You have a fever.  You have a stiff neck.  You have vision loss.  Your muscles feel weak or like you cannot control them.  You start to lose your balance often.  You have trouble walking.  You faint.  You have a seizure. Summary  A migraine headache is an intense, throbbing pain on one side or both sides of the head. Migraines may also cause other symptoms, such as nausea, vomiting, and sensitivity to light and noise.  This condition may be treated with medicines and lifestyle changes. You may also need to avoid certain things that trigger a migraine headache.  Keep a journal to find out what may trigger your migraine headaches.  Contact your health care provider if you have more than 15 headache days in a month or you develop symptoms that are different or more severe than your usual migraine headache symptoms. This information is not intended to replace advice given to you by your health care provider. Make sure you discuss any  questions you have with your health care provider. Document Revised: 07/31/2018 Document Reviewed: 05/21/2018 Elsevier Patient Education  2021 [employer].

## 2020-07-31 ENCOUNTER — Other Ambulatory Visit: Payer: Self-pay | Admitting: *Deleted

## 2020-07-31 DIAGNOSIS — R0683 Snoring: Secondary | ICD-10-CM

## 2020-08-16 DIAGNOSIS — F4312 Post-traumatic stress disorder, chronic: Secondary | ICD-10-CM | POA: Diagnosis not present

## 2020-08-16 DIAGNOSIS — F411 Generalized anxiety disorder: Secondary | ICD-10-CM | POA: Diagnosis not present

## 2020-08-16 DIAGNOSIS — F331 Major depressive disorder, recurrent, moderate: Secondary | ICD-10-CM | POA: Diagnosis not present

## 2020-08-21 DIAGNOSIS — F411 Generalized anxiety disorder: Secondary | ICD-10-CM | POA: Diagnosis not present

## 2020-08-30 ENCOUNTER — Ambulatory Visit: Payer: 59 | Attending: Neurology

## 2020-08-30 DIAGNOSIS — R0683 Snoring: Secondary | ICD-10-CM | POA: Diagnosis not present

## 2020-08-30 DIAGNOSIS — G4733 Obstructive sleep apnea (adult) (pediatric): Secondary | ICD-10-CM | POA: Diagnosis not present

## 2020-08-31 ENCOUNTER — Other Ambulatory Visit: Payer: Self-pay

## 2020-08-31 DIAGNOSIS — G43709 Chronic migraine without aura, not intractable, without status migrainosus: Secondary | ICD-10-CM | POA: Diagnosis not present

## 2020-08-31 DIAGNOSIS — N926 Irregular menstruation, unspecified: Secondary | ICD-10-CM | POA: Diagnosis not present

## 2020-08-31 DIAGNOSIS — F331 Major depressive disorder, recurrent, moderate: Secondary | ICD-10-CM | POA: Diagnosis not present

## 2020-08-31 DIAGNOSIS — M545 Low back pain, unspecified: Secondary | ICD-10-CM | POA: Diagnosis not present

## 2020-08-31 DIAGNOSIS — I10 Essential (primary) hypertension: Secondary | ICD-10-CM | POA: Diagnosis not present

## 2020-08-31 DIAGNOSIS — Z1389 Encounter for screening for other disorder: Secondary | ICD-10-CM | POA: Diagnosis not present

## 2020-08-31 DIAGNOSIS — R102 Pelvic and perineal pain: Secondary | ICD-10-CM | POA: Diagnosis not present

## 2020-08-31 DIAGNOSIS — E559 Vitamin D deficiency, unspecified: Secondary | ICD-10-CM | POA: Diagnosis not present

## 2020-08-31 DIAGNOSIS — M5417 Radiculopathy, lumbosacral region: Secondary | ICD-10-CM | POA: Diagnosis not present

## 2020-09-04 DIAGNOSIS — F411 Generalized anxiety disorder: Secondary | ICD-10-CM | POA: Diagnosis not present

## 2020-09-08 ENCOUNTER — Encounter: Payer: Self-pay | Admitting: *Deleted

## 2020-09-09 DIAGNOSIS — N888 Other specified noninflammatory disorders of cervix uteri: Secondary | ICD-10-CM | POA: Diagnosis not present

## 2020-09-09 DIAGNOSIS — N926 Irregular menstruation, unspecified: Secondary | ICD-10-CM | POA: Diagnosis not present

## 2020-09-09 DIAGNOSIS — R102 Pelvic and perineal pain: Secondary | ICD-10-CM | POA: Diagnosis not present

## 2020-09-09 DIAGNOSIS — N83291 Other ovarian cyst, right side: Secondary | ICD-10-CM | POA: Diagnosis not present

## 2020-09-13 DIAGNOSIS — F331 Major depressive disorder, recurrent, moderate: Secondary | ICD-10-CM | POA: Diagnosis not present

## 2020-09-13 DIAGNOSIS — F411 Generalized anxiety disorder: Secondary | ICD-10-CM | POA: Diagnosis not present

## 2020-09-13 DIAGNOSIS — F4312 Post-traumatic stress disorder, chronic: Secondary | ICD-10-CM | POA: Diagnosis not present

## 2020-09-22 ENCOUNTER — Other Ambulatory Visit: Payer: Self-pay

## 2020-09-23 DIAGNOSIS — F411 Generalized anxiety disorder: Secondary | ICD-10-CM | POA: Diagnosis not present

## 2020-10-03 ENCOUNTER — Other Ambulatory Visit: Payer: Self-pay

## 2020-10-05 ENCOUNTER — Encounter: Payer: Self-pay | Admitting: *Deleted

## 2020-10-09 ENCOUNTER — Other Ambulatory Visit: Payer: Self-pay

## 2020-10-12 ENCOUNTER — Other Ambulatory Visit: Payer: Self-pay

## 2020-10-12 ENCOUNTER — Ambulatory Visit: Payer: 59 | Attending: Neurology

## 2020-10-12 DIAGNOSIS — G4733 Obstructive sleep apnea (adult) (pediatric): Secondary | ICD-10-CM | POA: Insufficient documentation

## 2020-10-13 ENCOUNTER — Other Ambulatory Visit: Payer: Self-pay

## 2020-10-16 DIAGNOSIS — F411 Generalized anxiety disorder: Secondary | ICD-10-CM | POA: Diagnosis not present

## 2020-10-18 ENCOUNTER — Encounter: Payer: Self-pay | Admitting: Radiology

## 2020-10-25 DIAGNOSIS — N888 Other specified noninflammatory disorders of cervix uteri: Secondary | ICD-10-CM | POA: Diagnosis not present

## 2020-10-25 DIAGNOSIS — N83201 Unspecified ovarian cyst, right side: Secondary | ICD-10-CM | POA: Diagnosis not present

## 2020-10-26 DIAGNOSIS — Z1389 Encounter for screening for other disorder: Secondary | ICD-10-CM | POA: Diagnosis not present

## 2020-10-26 DIAGNOSIS — N83201 Unspecified ovarian cyst, right side: Secondary | ICD-10-CM | POA: Diagnosis not present

## 2020-10-26 DIAGNOSIS — R102 Pelvic and perineal pain: Secondary | ICD-10-CM | POA: Diagnosis not present

## 2020-11-08 DIAGNOSIS — F4312 Post-traumatic stress disorder, chronic: Secondary | ICD-10-CM | POA: Diagnosis not present

## 2020-11-08 DIAGNOSIS — F331 Major depressive disorder, recurrent, moderate: Secondary | ICD-10-CM | POA: Diagnosis not present

## 2020-11-08 DIAGNOSIS — F411 Generalized anxiety disorder: Secondary | ICD-10-CM | POA: Diagnosis not present

## 2020-11-27 DIAGNOSIS — F411 Generalized anxiety disorder: Secondary | ICD-10-CM | POA: Diagnosis not present

## 2020-12-10 ENCOUNTER — Emergency Department
Admission: EM | Admit: 2020-12-10 | Discharge: 2020-12-10 | Disposition: A | Discharge status: Home / Self Care | Payer: PRIVATE HEALTH INSURANCE | Attending: Physician Assistant | Admitting: Physician Assistant

## 2020-12-10 ENCOUNTER — Emergency Department: Payer: PRIVATE HEALTH INSURANCE

## 2020-12-10 ENCOUNTER — Other Ambulatory Visit: Payer: Self-pay

## 2020-12-10 DIAGNOSIS — Y990 Civilian activity done for income or pay: Secondary | ICD-10-CM | POA: Diagnosis not present

## 2020-12-10 DIAGNOSIS — M25511 Pain in right shoulder: Secondary | ICD-10-CM | POA: Insufficient documentation

## 2020-12-10 DIAGNOSIS — S161XXA Strain of muscle, fascia and tendon at neck level, initial encounter: Secondary | ICD-10-CM | POA: Diagnosis not present

## 2020-12-10 DIAGNOSIS — S199XXA Unspecified injury of neck, initial encounter: Secondary | ICD-10-CM | POA: Diagnosis present

## 2020-12-10 MED ORDER — LIDOCAINE 5 % EX PTCH
1.0000 | MEDICATED_PATCH | Freq: Once | CUTANEOUS | Status: DC
  Administered 2020-12-10: 1 via TRANSDERMAL
  Filled 2020-12-10: qty 1

## 2020-12-10 MED ORDER — NAPROXEN 500 MG PO TABS
500.0000 mg | ORAL_TABLET | Freq: Two times a day (BID) | ORAL | 0 refills | Status: AC
  Filled 2020-12-10: qty 30, 15d supply, fill #0

## 2020-12-10 MED ORDER — KETOROLAC TROMETHAMINE 30 MG/ML IJ SOLN
30.0000 mg | Freq: Once | INTRAMUSCULAR | Status: AC
  Administered 2020-12-10: 30 mg via INTRAMUSCULAR
  Filled 2020-12-10: qty 1

## 2020-12-10 MED ORDER — CYCLOBENZAPRINE HCL 5 MG PO TABS
5.0000 mg | ORAL_TABLET | Freq: Three times a day (TID) | ORAL | 0 refills | Status: DC | PRN
  Filled 2020-12-10: qty 15, 5d supply, fill #0

## 2020-12-10 MED ORDER — LIDOCAINE 5 % EX PTCH
1.0000 | MEDICATED_PATCH | Freq: Two times a day (BID) | CUTANEOUS | 0 refills | Status: AC | PRN
  Filled 2020-12-10: qty 10, 10d supply, fill #0

## 2020-12-10 NOTE — ED Notes (Signed)
[employer] requested for worker's comp
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
## 2020-12-10 NOTE — ED Notes (Signed)
Waiting on [facility] for right documents to fill out for workers comp.
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
## 2020-12-10 NOTE — ED Notes (Signed)
Workers comp form completed at this time by provider.

## 2020-12-10 NOTE — ED Notes (Signed)
Workers comp form handed over to provider to be filled out at this time.

## 2020-12-10 NOTE — Discharge Instructions (Addendum)
The prescription meds as provided.  Apply ice and/or moist heat to the sore muscles for additional pain relief.  Follow-up with employee health prior to your next scheduled workday.

## 2020-12-11 ENCOUNTER — Other Ambulatory Visit: Payer: Self-pay

## 2020-12-15 ENCOUNTER — Other Ambulatory Visit: Payer: Self-pay | Admitting: Orthopedic Surgery

## 2020-12-15 ENCOUNTER — Other Ambulatory Visit (HOSPITAL_COMMUNITY): Payer: Self-pay | Admitting: Orthopedic Surgery

## 2020-12-15 DIAGNOSIS — M542 Cervicalgia: Secondary | ICD-10-CM

## 2020-12-20 DIAGNOSIS — F411 Generalized anxiety disorder: Secondary | ICD-10-CM | POA: Diagnosis not present

## 2020-12-22 DIAGNOSIS — Z9851 Tubal ligation status: Secondary | ICD-10-CM | POA: Diagnosis not present

## 2020-12-22 DIAGNOSIS — R102 Pelvic and perineal pain: Secondary | ICD-10-CM | POA: Diagnosis not present

## 2020-12-22 DIAGNOSIS — Q625 Duplication of ureter: Secondary | ICD-10-CM | POA: Diagnosis not present

## 2020-12-22 DIAGNOSIS — K429 Umbilical hernia without obstruction or gangrene: Secondary | ICD-10-CM | POA: Diagnosis not present

## 2020-12-25 DIAGNOSIS — F411 Generalized anxiety disorder: Secondary | ICD-10-CM | POA: Diagnosis not present

## 2020-12-27 ENCOUNTER — Ambulatory Visit (INDEPENDENT_AMBULATORY_CARE_PROVIDER_SITE_OTHER): Payer: 59 | Admitting: Family Medicine

## 2020-12-27 ENCOUNTER — Ambulatory Visit
Admission: RE | Admit: 2020-12-27 | Discharge: 2020-12-27 | Disposition: A | Discharge status: Home / Self Care | Payer: 59 | Source: Ambulatory Visit | Attending: Orthopedic Surgery | Admitting: Orthopedic Surgery

## 2020-12-27 ENCOUNTER — Other Ambulatory Visit: Payer: Self-pay

## 2020-12-27 ENCOUNTER — Other Ambulatory Visit (HOSPITAL_COMMUNITY)
Admission: RE | Admit: 2020-12-27 | Discharge: 2020-12-27 | Disposition: A | Discharge status: Home / Self Care | Payer: 59 | Source: Ambulatory Visit | Attending: Family Medicine | Admitting: Family Medicine

## 2020-12-27 ENCOUNTER — Encounter: Payer: Self-pay | Admitting: Family Medicine

## 2020-12-27 VITALS — BP 97/66 | HR 79 | Ht 67.0 in | Wt 266.2 lb

## 2020-12-27 DIAGNOSIS — M542 Cervicalgia: Secondary | ICD-10-CM | POA: Diagnosis not present

## 2020-12-27 DIAGNOSIS — R87811 Vaginal high risk human papillomavirus (HPV) DNA test positive: Secondary | ICD-10-CM | POA: Diagnosis not present

## 2020-12-27 DIAGNOSIS — N920 Excessive and frequent menstruation with regular cycle: Secondary | ICD-10-CM | POA: Insufficient documentation

## 2020-12-27 DIAGNOSIS — R102 Pelvic and perineal pain: Secondary | ICD-10-CM | POA: Diagnosis not present

## 2020-12-27 LAB — POCT URINALYSIS DIPSTICK
Bilirubin, UA: NEGATIVE
Blood, UA: NEGATIVE
Glucose, UA: NEGATIVE
Ketones, UA: NEGATIVE
Leukocytes, UA: NEGATIVE
Nitrite, UA: NEGATIVE
Odor: NEGATIVE
Protein, UA: NEGATIVE
Spec Grav, UA: 1.01 (ref 1.010–1.025)
Urobilinogen, UA: 0.2 E.U./dL
pH, UA: 7 (ref 5.0–8.0)

## 2020-12-27 LAB — POCT URINE PREGNANCY: Preg Test, Ur: NEGATIVE

## 2020-12-27 NOTE — Progress Notes (Addendum)
GYNECOLOGY PROBLEM  VISIT ENCOUNTER NOTE  Subjective:   Kelly [name] is a 45 y.o. G73P0 female here for a a problem GYN visit.  Current complaints: second opinion on management of ovarian cyst and painful, heavier menses. Patient states she has had symptoms since May 2022. She had a transvaginal ultrasound which showed a 1.4 cm ovarian cyst at that time. In July, a repeat US demonstrated resolution of the ovarian cyst and the presence of nabothian cysts. Patient's symptoms have persisted despite [place] results. She has not tried any medications for pain relief. Patient denies any discharge. She also mentions that a pap smear performed Sept 2021 at OSH demonstrated positivity for HPV, and she is interested in following up.   Gynecologic History Patient's last menstrual period was 12/10/2020. Contraception: none  Health Maintenance Due  Topic Date Due   COVID-19 Vaccine (1) Never done   Pneumococcal Vaccine 13-69 Years old (1 - PCV) Never done   HIV Screening  Never done   Hepatitis C Screening  Never done   TETANUS/TDAP  Never done   PAP SMEAR-Modifier  Never done   INFLUENZA VACCINE  Never done   The following portions of the patient's history were reviewed and updated as appropriate: allergies, current medications, past family history, past medical history, past social history, past surgical history and problem list.  Review of Systems Pertinent items are noted in HPI.   Objective:  BP 97/66   Pulse 79   Ht 5\' 7"  (1.702 m)   Wt 266 lb 3.2 oz (120.7 kg)   LMP 12/10/2020   BMI 41.69 kg/m  Gen: Well appearing, NAD HEENT: No scleral icterus CV: RR [name]: Normal WOB Ext: Warm, well perfused  PELVIC: Normal appearing external genitalia; normal appearing vaginal mucosa and cervix.  No abnormal discharge noted. Pap smear obtained.  Normal uterine size, no other palpable masses, no uterine or adnexal tenderness.   Assessment and Plan:  1. Menorrhagia with regular cycle Patient's  change in menstrual habits likely secondary to normal changes with aging. 12/12/2020 and exam findings are reassuring against worrisome pathology. Given past abnormal pap results, will follow cytology today. Will get urine pregnancy to rule out. - POCT urine pregnancy - Cytology - PAP( [place]) - TSH - Tylenol 1000 mg Q6H and Motrin 800 mg Q6H at onset of menses  2. Pelvic pain Patient's pelvic pain with menses also likely secondary to normal changes with aging. [place] and exam findings are reassuring against worrisome pathology. Will get UA to rule out UTI. - POCT Urinalysis Dipstick - Cytology - PAP( [place]) - TSH - Tylenol 1000 mg Q6H and Motrin 800 mg Q6H at onset of menses  3. Vaginal high risk HPV DNA test positive Patient's past positivity for HPV on pap smear warrants follow up pap smear today. Will follow cytology. - Cytology - PAP( )  Please refer to After Visit Summary for other counseling recommendations.   No follow-ups on file.  [place] Declynn Lopresti MS3 Faculty Practice, Center for [facility]   Attestation of Supervision of Student:  I confirm that I have verified the information documented in the medical student's note and that I have also personally reperformed the history, physical exam and all medical decision making activities.  I have verified that all services and findings are accurately documented in this student's note; and I agree with management and plan as outlined in the documentation. I have also made any necessary editorial changes.  The  patient desired a second opinion her her ovarian cyst and we provided this today. The cyst appears resolved. Additionally the patient had other GYN complaints per above that were addressed.   Federico Flake, MD Center for [facility], [facility] 12/29/2020 4:07 PM

## 2020-12-27 NOTE — Progress Notes (Signed)
Pt presents for 2nd opinion for ovarian cyst. U/S endovaginal completed 10/25/20 - small nabothian cysts at cervix.  U/S pelvis TA/TV completed 09/09/20 - 1.4 cm complex cystic mass on R ovary. Pt c/o bilateral pelvic pain since May 2022. Pt also c/o irregular, "sometimes late," and heavy cycles. Abnormal pap in Oct 2021? per pt

## 2020-12-28 LAB — TSH: TSH: 2.65 u[IU]/mL (ref 0.450–4.500)

## 2020-12-29 ENCOUNTER — Encounter: Payer: Self-pay | Admitting: Family Medicine

## 2020-12-29 NOTE — Progress Notes (Signed)
Attestation of Supervision of Student:  I confirm that I have verified the information documented in the medical student's note and that I have also personally reperformed the history, physical exam and all medical decision making activities.  I have verified that all services and findings are accurately documented in this student's note; and I agree with management and plan as outlined in the documentation. I have also made any necessary editorial changes.  Federico Flake, MD Center for [facility] 12/29/2020 4:10 PM

## 2021-01-01 ENCOUNTER — Other Ambulatory Visit: Payer: Self-pay

## 2021-01-01 MED ORDER — PREDNISONE 5 MG PO TABS
ORAL_TABLET | ORAL | 0 refills | Status: DC
  Filled 2021-01-01: qty 42, 12d supply, fill #0

## 2021-01-04 ENCOUNTER — Encounter: Payer: Self-pay | Admitting: Physical Therapy

## 2021-01-04 ENCOUNTER — Ambulatory Visit: Payer: PRIVATE HEALTH INSURANCE | Attending: Orthopedic Surgery | Admitting: Physical Therapy

## 2021-01-04 DIAGNOSIS — M542 Cervicalgia: Secondary | ICD-10-CM | POA: Diagnosis present

## 2021-01-04 DIAGNOSIS — F411 Generalized anxiety disorder: Secondary | ICD-10-CM | POA: Diagnosis not present

## 2021-01-04 LAB — CYTOLOGY - PAP
Chlamydia: NEGATIVE
Comment: NEGATIVE
Comment: NEGATIVE
Comment: NEGATIVE
Comment: NEGATIVE
Comment: NORMAL
Diagnosis: UNDETERMINED — AB
HPV 16: NEGATIVE
HPV 18 / 45: POSITIVE — AB
High risk HPV: POSITIVE — AB
Neisseria Gonorrhea: NEGATIVE
Trichomonas: NEGATIVE
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
## 2021-01-08 ENCOUNTER — Ambulatory Visit: Payer: PRIVATE HEALTH INSURANCE | Attending: Orthopedic Surgery

## 2021-01-08 DIAGNOSIS — F411 Generalized anxiety disorder: Secondary | ICD-10-CM | POA: Diagnosis not present

## 2021-01-08 DIAGNOSIS — M542 Cervicalgia: Secondary | ICD-10-CM | POA: Diagnosis present
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
## 2021-01-09 ENCOUNTER — Ambulatory Visit: Payer: PRIVATE HEALTH INSURANCE | Admitting: Physical Therapy

## 2021-01-12 ENCOUNTER — Other Ambulatory Visit: Payer: Self-pay | Admitting: Physician Assistant

## 2021-01-15 ENCOUNTER — Other Ambulatory Visit: Payer: Self-pay | Admitting: Physician Assistant

## 2021-01-15 DIAGNOSIS — G4730 Sleep apnea, unspecified: Secondary | ICD-10-CM

## 2021-01-16 ENCOUNTER — Other Ambulatory Visit: Payer: Self-pay | Admitting: Physician Assistant

## 2021-01-16 DIAGNOSIS — G4733 Obstructive sleep apnea (adult) (pediatric): Secondary | ICD-10-CM

## 2021-01-16 NOTE — Progress Notes (Signed)
Pt needing order to receive CPAP.    Sleep study done 08/30/20 CPAP titration 10/12/20 AHI 15.9

## 2021-01-17 DIAGNOSIS — R079 Chest pain, unspecified: Secondary | ICD-10-CM | POA: Diagnosis not present

## 2021-01-17 DIAGNOSIS — Z0000 Encounter for general adult medical examination without abnormal findings: Secondary | ICD-10-CM | POA: Diagnosis not present

## 2021-01-17 DIAGNOSIS — R102 Pelvic and perineal pain: Secondary | ICD-10-CM | POA: Diagnosis not present

## 2021-01-17 DIAGNOSIS — R7989 Other specified abnormal findings of blood chemistry: Secondary | ICD-10-CM | POA: Diagnosis not present

## 2021-01-19 ENCOUNTER — Ambulatory Visit: Payer: PRIVATE HEALTH INSURANCE | Admitting: Physical Therapy

## 2021-01-19 ENCOUNTER — Other Ambulatory Visit: Payer: Self-pay | Admitting: *Deleted

## 2021-01-19 ENCOUNTER — Other Ambulatory Visit: Payer: Self-pay

## 2021-01-19 ENCOUNTER — Telehealth: Payer: Self-pay | Admitting: Radiology

## 2021-01-19 ENCOUNTER — Encounter: Payer: Self-pay | Admitting: Physical Therapy

## 2021-01-19 ENCOUNTER — Encounter: Payer: Self-pay | Admitting: Radiology

## 2021-01-19 DIAGNOSIS — M542 Cervicalgia: Secondary | ICD-10-CM | POA: Diagnosis not present

## 2021-01-19 DIAGNOSIS — G4733 Obstructive sleep apnea (adult) (pediatric): Secondary | ICD-10-CM
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
## 2021-01-19 NOTE — Telephone Encounter (Signed)
Informed patient that CPAP order was faxed to [facility] in [place] for new machine. Patient to contact office if [facility] does not contact her with in a week.

## 2021-01-22 ENCOUNTER — Encounter: Payer: Self-pay | Admitting: Physical Therapy

## 2021-01-22 ENCOUNTER — Ambulatory Visit: Payer: PRIVATE HEALTH INSURANCE | Attending: Orthopedic Surgery | Admitting: Physical Therapy

## 2021-01-22 ENCOUNTER — Other Ambulatory Visit: Payer: Self-pay

## 2021-01-22 DIAGNOSIS — M542 Cervicalgia: Secondary | ICD-10-CM | POA: Diagnosis not present
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
## 2021-01-24 ENCOUNTER — Ambulatory Visit: Payer: PRIVATE HEALTH INSURANCE | Admitting: Physical Therapy

## 2021-01-24 DIAGNOSIS — M542 Cervicalgia: Secondary | ICD-10-CM | POA: Diagnosis not present
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
## 2021-01-31 ENCOUNTER — Ambulatory Visit: Payer: PRIVATE HEALTH INSURANCE | Admitting: Physical Therapy

## 2021-01-31 DIAGNOSIS — F411 Generalized anxiety disorder: Secondary | ICD-10-CM | POA: Diagnosis not present

## 2021-02-01 ENCOUNTER — Ambulatory Visit: Payer: PRIVATE HEALTH INSURANCE | Admitting: Physical Therapy

## 2021-02-01 ENCOUNTER — Encounter: Payer: Self-pay | Admitting: Physical Therapy

## 2021-02-01 DIAGNOSIS — M542 Cervicalgia: Secondary | ICD-10-CM

## 2021-02-01 NOTE — Therapy (Signed)
[facility] [address] [place], [address] Phone: [phone]   Fax:  [phone]  Physical Therapy Treatment  Patient Details  Name: Kelly [name] MRN: [id number] Date of Birth: [birth year] No data recorded  Encounter Date: 02/01/2021   PT End of Session - 02/01/21 0820     Visit Number 6    Number of Visits 8    PT Start Time 0815    PT Stop Time 0853    PT Time Calculation (min) 38 min    Activity Tolerance Patient tolerated treatment well    Behavior During Therapy [facility] for tasks assessed/performed             Past Medical History:  Diagnosis Date   Anxiety    Depression    GERD (gastroesophageal reflux disease)    Hyperlipemia    Myofascial pain syndrome     Past Surgical History:  Procedure Laterality Date   CESAREAN SECTION     CHOLECYSTECTOMY     TUBAL LIGATION      There were no vitals filed for this visit.   Subjective Assessment - 02/01/21 0818     Subjective Pt denies any neck or back pain this morning but does report an increase in tingling sensation BUE. She reports seeing the orthopedist and continuing light work duty for an additional month.    Pertinent History Kelly [name] is a 45 y.o. female who presents to therapy with c/o neck pain with numbness in BUE following an injury d/t restraining a psych patient. She denies being struck my the patient but she states she was jarred by the thrashing. 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 lifting, carrying, restraining). She reports her UE numbness and neck pain mostly occur with lifitng objects. She reports her neck pain at best is 0 and worst a 6/10. She reports rest alleviates her pain. She denies any unexplained weight fluctuation, saddle paresthesia, loss of bowel/bladder function, or unrelenting night pain at this time.    Limitations Lifting;House  hold activities;Walking    How long can you sit comfortably? 60 min    How long can you walk comfortably? 60 min    Diagnostic tests CT    Patient Stated Goals Being able to lift and return to full duty             Therapeutic Exercise   Nu Step L2 x 5 min UE 7  Y raises with foam roller RTB 3 x 10 reps   W Raises RTB 2 x10 reps    Dynamic Hugs RTB 3 x 10 reps with emphasis on scap protraction   Thread the Needle x 3 reps x 5 sec hold each position   Open books Rotation 3 x 30sec   Deep neck flexion hold 2 x 25 sec with cues for initial set up                               PT Education - 02/01/21 0820     Education Details therex form/technique    Person(s) Educated Patient    Methods Explanation;Demonstration    Comprehension Returned demonstration;Verbalized understanding              PT Short Term Goals - 01/04/21 1259       PT SHORT TERM GOAL #  1   Title Pt will demonstrate independence with HEP to improve cervical strength for increased ability to participate with ADLs    Baseline HEP given    Time 2    Period Weeks    Status New               PT Long Term Goals - 01/04/21 1303       PT LONG TERM GOAL #1   Title Patient will increase FOTO score to 70 to demonstrate predicted increase in functional mobility to complete ADLs    Baseline 01/04/21: 48    Time 8    Period Weeks    Status New      PT LONG TERM GOAL #2   Title Pt will decrease worst neck pain as reported on NPRS by at least 2 points in order to demonstrate clinically significant reduction in neck pain.    Baseline 01/04/21: 6    Time 8    Period Weeks    Status New      PT LONG TERM GOAL #3   Title Patient will perform neck flexion test for 30 sec in order to demonstrate increased deep neck flexor strength and stability.    Baseline 01/04/21: 14sec    Time 8    Period Weeks    Status New                   Plan - 02/01/21 5465      Clinical Impression Statement PT continued periscapular and cervical muscle strengthening this session. Pt reports increase in bilateral numbness/tingling in BUE prior to therex with no reports of improvement following. Pt continues to display good motivation with strengthening exercises with minimal cueing to correct form. Patient will continue to benefit from skilled therapy for exercise performance to ensure proper technique for safe and effective progression. Continue PT POC as able    Personal Factors and Comorbidities Comorbidity 1    Examination-Activity Limitations Lift;Carry;Sleep    Examination-Participation Restrictions Community Activity;Occupation;Cleaning;Laundry    Stability/Clinical Decision Making Stable/Uncomplicated    Clinical Decision Making Low    Rehab Potential Good    PT Frequency 1x / week    PT Duration 8 weeks    PT Treatment/Interventions Cryotherapy;Electrical Stimulation;Moist Heat;Traction;Ultrasound;Therapeutic exercise;Manual techniques;Passive range of motion;Dry needling;Neuromuscular re-education;Spinal Manipulations;Joint Manipulations;ADLs/Self Care Home Management;Therapeutic activities;Functional mobility training;Balance training    PT Next Visit Plan periscapular strength    PT Home Exercise Plan cervical iso; DNF hold    Consulted and Agree with Plan of Care Patient             Patient will benefit from skilled therapeutic intervention in order to improve the following deficits and impairments:  Decreased endurance, Impaired sensation, Obesity, Improper body mechanics, Decreased range of motion, Decreased activity tolerance, Decreased strength, Hypermobility, Postural dysfunction, Pain  Visit Diagnosis: Neck pain, bilateral     Problem List Patient Active Problem List   Diagnosis Date Noted   Chronic migraine without aura without status migrainosus, not intractable 04/30/2019   Migraine without aura and without status migrainosus, not  intractable 02/05/2019   Medication overuse headache 02/05/2019     Kelly [name] DPT Kelly [name], SPT  Kelly [name], PT 02/02/2021, 8:53 AM  [place] [address] [place], [address] Phone: [phone]   Fax:  [phone]  Name: Kelly [name] MRN: [id number] Date of Birth: [birth year]

## 2021-02-05 ENCOUNTER — Other Ambulatory Visit: Payer: Self-pay | Admitting: Physician Assistant

## 2021-02-05 ENCOUNTER — Ambulatory Visit: Payer: 59 | Attending: Orthopedic Surgery | Admitting: Physical Therapy

## 2021-02-07 DIAGNOSIS — F411 Generalized anxiety disorder: Secondary | ICD-10-CM | POA: Diagnosis not present

## 2021-02-07 DIAGNOSIS — Z8249 Family history of ischemic heart disease and other diseases of the circulatory system: Secondary | ICD-10-CM | POA: Diagnosis not present

## 2021-02-07 DIAGNOSIS — Z823 Family history of stroke: Secondary | ICD-10-CM | POA: Diagnosis not present

## 2021-02-07 DIAGNOSIS — R079 Chest pain, unspecified: Secondary | ICD-10-CM | POA: Diagnosis not present

## 2021-02-15 DIAGNOSIS — R001 Bradycardia, unspecified: Secondary | ICD-10-CM | POA: Diagnosis not present

## 2021-02-15 DIAGNOSIS — F411 Generalized anxiety disorder: Secondary | ICD-10-CM | POA: Diagnosis not present

## 2021-02-15 DIAGNOSIS — R102 Pelvic and perineal pain: Secondary | ICD-10-CM | POA: Diagnosis not present

## 2021-02-15 DIAGNOSIS — R0602 Shortness of breath: Secondary | ICD-10-CM | POA: Diagnosis not present

## 2021-02-15 DIAGNOSIS — K429 Umbilical hernia without obstruction or gangrene: Secondary | ICD-10-CM | POA: Diagnosis not present

## 2021-02-15 DIAGNOSIS — Z6841 Body Mass Index (BMI) 40.0 and over, adult: Secondary | ICD-10-CM | POA: Diagnosis not present

## 2021-02-15 DIAGNOSIS — F4312 Post-traumatic stress disorder, chronic: Secondary | ICD-10-CM | POA: Diagnosis not present

## 2021-02-15 DIAGNOSIS — R079 Chest pain, unspecified: Secondary | ICD-10-CM | POA: Diagnosis not present

## 2021-02-15 DIAGNOSIS — F331 Major depressive disorder, recurrent, moderate: Secondary | ICD-10-CM | POA: Diagnosis not present

## 2021-02-19 ENCOUNTER — Encounter: Payer: 59 | Admitting: Physical Therapy

## 2021-02-20 ENCOUNTER — Telehealth: Payer: Self-pay

## 2021-02-20 ENCOUNTER — Ambulatory Visit: Payer: PRIVATE HEALTH INSURANCE | Attending: Orthopedic Surgery | Admitting: Physical Therapy

## 2021-02-20 NOTE — Telephone Encounter (Signed)
SPT called patient regarding No Show 02/20/21 at 8:15 am. Patient did not answer or have voicemail box set-up.

## 2021-02-21 ENCOUNTER — Other Ambulatory Visit: Payer: Self-pay | Admitting: Family Medicine

## 2021-02-21 ENCOUNTER — Other Ambulatory Visit: Payer: Self-pay

## 2021-02-21 ENCOUNTER — Ambulatory Visit: Payer: PRIVATE HEALTH INSURANCE | Admitting: Physical Therapy

## 2021-02-21 ENCOUNTER — Ambulatory Visit (INDEPENDENT_AMBULATORY_CARE_PROVIDER_SITE_OTHER): Payer: 59 | Admitting: Family Medicine

## 2021-02-21 VITALS — BP 95/66 | HR 81

## 2021-02-21 DIAGNOSIS — Z01812 Encounter for preprocedural laboratory examination: Secondary | ICD-10-CM

## 2021-02-21 DIAGNOSIS — Z23 Encounter for immunization: Secondary | ICD-10-CM | POA: Diagnosis not present

## 2021-02-21 DIAGNOSIS — R87810 Cervical high risk human papillomavirus (HPV) DNA test positive: Secondary | ICD-10-CM | POA: Diagnosis not present

## 2021-02-21 DIAGNOSIS — N870 Mild cervical dysplasia: Secondary | ICD-10-CM | POA: Diagnosis not present

## 2021-02-21 DIAGNOSIS — R87610 Atypical squamous cells of undetermined significance on cytologic smear of cervix (ASC-US): Secondary | ICD-10-CM

## 2021-02-21 LAB — POCT URINE PREGNANCY: Preg Test, Ur: NEGATIVE

## 2021-02-21 NOTE — Progress Notes (Signed)
    GYNECOLOGY OFFICE Note with COLPOSCOPY   45 y.o. G3P0 here for colposcopy for ASCUS with POSITIVE high risk HPV pap smear on 12/27/20.   BP 95/66   Pulse 81    Patient gave informed written consent, time out was performed.  Placed in lithotomy position. Cervix viewed with speculum and colposcope after application of acetic acid.   Colposcopy adequate? Yes  acetowhite lesion(s) noted at 6 to 10 with density 6-9 o'clock and punctation noted at 6  o'clock; corresponding biopsies obtained.  ECC specimen obtained. All specimens were labeled and sent to pathology.   Assessment and plan: Discussed role for HPV in cervical dysplasia, need for surveillance.  Patient was given post procedure instructions.  Will follow up pathology and manage accordingly; patient will be contacted with results and recommendations.  Routine preventative health maintenance measures emphasized.  Offered HPV vaccination series per ACIP guidelines for age< 45.  Federico Flake, MD, MPH, ABFM, [facility] Attending Physician [facility]

## 2021-02-23 ENCOUNTER — Encounter: Payer: 59 | Admitting: Physical Therapy

## 2021-02-26 ENCOUNTER — Ambulatory Visit: Payer: PRIVATE HEALTH INSURANCE | Admitting: Physical Therapy

## 2021-02-26 DIAGNOSIS — F411 Generalized anxiety disorder: Secondary | ICD-10-CM | POA: Diagnosis not present

## 2021-02-28 DIAGNOSIS — R079 Chest pain, unspecified: Secondary | ICD-10-CM | POA: Diagnosis not present

## 2021-02-28 DIAGNOSIS — R0602 Shortness of breath: Secondary | ICD-10-CM | POA: Diagnosis not present

## 2021-03-02 ENCOUNTER — Encounter: Payer: PRIVATE HEALTH INSURANCE | Admitting: Physical Therapy

## 2021-03-05 ENCOUNTER — Other Ambulatory Visit: Payer: Self-pay

## 2021-03-05 DIAGNOSIS — F411 Generalized anxiety disorder: Secondary | ICD-10-CM | POA: Diagnosis not present

## 2021-03-09 ENCOUNTER — Observation Stay
Admit: 2021-03-09 | Discharge: 2021-03-09 | Disposition: A | Discharge status: Home / Self Care | Payer: 59 | Attending: Internal Medicine | Admitting: Internal Medicine

## 2021-03-09 ENCOUNTER — Emergency Department: Payer: 59

## 2021-03-09 ENCOUNTER — Other Ambulatory Visit: Payer: Self-pay

## 2021-03-09 ENCOUNTER — Encounter: Payer: Self-pay | Admitting: Emergency Medicine

## 2021-03-09 ENCOUNTER — Observation Stay
Admission: EM | Admit: 2021-03-09 | Discharge: 2021-03-10 | Disposition: A | Discharge status: Home / Self Care | Payer: 59 | Attending: Internal Medicine | Admitting: Internal Medicine

## 2021-03-09 DIAGNOSIS — R55 Syncope and collapse: Principal | ICD-10-CM | POA: Diagnosis present

## 2021-03-09 DIAGNOSIS — R531 Weakness: Secondary | ICD-10-CM | POA: Diagnosis not present

## 2021-03-09 DIAGNOSIS — I4581 Long QT syndrome: Secondary | ICD-10-CM | POA: Diagnosis not present

## 2021-03-09 DIAGNOSIS — Z20822 Contact with and (suspected) exposure to covid-19: Secondary | ICD-10-CM | POA: Insufficient documentation

## 2021-03-09 DIAGNOSIS — Z7982 Long term (current) use of aspirin: Secondary | ICD-10-CM | POA: Diagnosis not present

## 2021-03-09 DIAGNOSIS — R0602 Shortness of breath: Secondary | ICD-10-CM | POA: Diagnosis not present

## 2021-03-09 DIAGNOSIS — R9431 Abnormal electrocardiogram [ECG] [EKG]: Secondary | ICD-10-CM | POA: Insufficient documentation

## 2021-03-09 LAB — MAGNESIUM
Magnesium: 1.9 mg/dL (ref 1.7–2.4)
Magnesium: 2 mg/dL (ref 1.7–2.4)

## 2021-03-09 LAB — ECHOCARDIOGRAM COMPLETE
AR max vel: 3.66 cm2
AV Area VTI: 3.89 cm2
AV Area mean vel: 3.16 cm2
AV Mean grad: 2 mmHg
AV Peak grad: 3.9 mmHg
Ao pk vel: 0.99 m/s
Area-P 1/2: 3.48 cm2
Height: 67 in
MV VTI: 3.31 cm2
S' Lateral: 3.31 cm
Weight: 4128 oz

## 2021-03-09 LAB — CBC WITH DIFFERENTIAL/PLATELET
Abs Immature Granulocytes: 0.01 10*3/uL (ref 0.00–0.07)
Basophils Absolute: 0 10*3/uL (ref 0.0–0.1)
Basophils Relative: 1 %
Eosinophils Absolute: 0.2 10*3/uL (ref 0.0–0.5)
Eosinophils Relative: 4 %
HCT: 39.6 % (ref 36.0–46.0)
Hemoglobin: 12.7 g/dL (ref 12.0–15.0)
Immature Granulocytes: 0 %
Lymphocytes Relative: 30 %
Lymphs Abs: 1.7 10*3/uL (ref 0.7–4.0)
MCH: 27.8 pg (ref 26.0–34.0)
MCHC: 32.1 g/dL (ref 30.0–36.0)
MCV: 86.7 fL (ref 80.0–100.0)
Monocytes Absolute: 0.4 10*3/uL (ref 0.1–1.0)
Monocytes Relative: 6 %
Neutro Abs: 3.2 10*3/uL (ref 1.7–7.7)
Neutrophils Relative %: 59 %
Platelets: 283 10*3/uL (ref 150–400)
RBC: 4.57 MIL/uL (ref 3.87–5.11)
RDW: 13.7 % (ref 11.5–15.5)
WBC: 5.5 10*3/uL (ref 4.0–10.5)
nRBC: 0 % (ref 0.0–0.2)

## 2021-03-09 LAB — HIV ANTIBODY (ROUTINE TESTING W REFLEX): HIV Screen 4th Generation wRfx: NONREACTIVE

## 2021-03-09 LAB — RESP PANEL BY RT-PCR (FLU A&B, COVID) ARPGX2
Influenza A by PCR: NEGATIVE
Influenza B by PCR: NEGATIVE
SARS Coronavirus 2 by RT PCR: NEGATIVE

## 2021-03-09 LAB — COMPREHENSIVE METABOLIC PANEL
ALT: 17 U/L (ref 0–44)
AST: 18 U/L (ref 15–41)
Albumin: 4.1 g/dL (ref 3.5–5.0)
Alkaline Phosphatase: 75 U/L (ref 38–126)
Anion gap: 7 (ref 5–15)
BUN: 15 mg/dL (ref 6–20)
CO2: 20 mmol/L — ABNORMAL LOW (ref 22–32)
Calcium: 9.6 mg/dL (ref 8.9–10.3)
Chloride: 109 mmol/L (ref 98–111)
Creatinine, Ser: 1.04 mg/dL — ABNORMAL HIGH (ref 0.44–1.00)
GFR, Estimated: >60 mL/min (ref >60)
Glucose, Bld: 119 mg/dL — ABNORMAL HIGH (ref 70–99)
Potassium: 3.5 mmol/L (ref 3.5–5.1)
Sodium: 136 mmol/L (ref 135–145)
Total Bilirubin: 0.5 mg/dL (ref 0.3–1.2)
Total Protein: 7.8 g/dL (ref 6.5–8.1)

## 2021-03-09 LAB — TROPONIN I (HIGH SENSITIVITY)
Troponin I (High Sensitivity): 3 ng/L (ref <18)
Troponin I (High Sensitivity): 3 ng/L (ref <18)

## 2021-03-09 LAB — LIPASE, BLOOD: Lipase: 39 U/L (ref 11–51)

## 2021-03-09 LAB — D-DIMER, QUANTITATIVE: D-Dimer, Quant: 0.48 ug/mL-FEU (ref 0.00–0.50)

## 2021-03-09 LAB — PHOSPHORUS: Phosphorus: 3.7 mg/dL (ref 2.5–4.6)

## 2021-03-09 MED ORDER — MELATONIN 5 MG PO TABS
5.0000 mg | ORAL_TABLET | Freq: Every day | ORAL | Status: DC
  Administered 2021-03-09: 5 mg via ORAL
  Filled 2021-03-09: qty 1

## 2021-03-09 MED ORDER — ACETAMINOPHEN 650 MG RE SUPP: 650.0000 mg | Freq: Four times a day (QID) | RECTAL | Status: DC | PRN

## 2021-03-09 MED ORDER — TOPIRAMATE 25 MG PO TABS
200.0000 mg | ORAL_TABLET | Freq: Every day | ORAL | Status: DC
  Administered 2021-03-09: 200 mg via ORAL
  Filled 2021-03-09: qty 8

## 2021-03-09 MED ORDER — SODIUM CHLORIDE 0.9% FLUSH: 3.0000 mL | INTRAVENOUS | Status: DC | PRN

## 2021-03-09 MED ORDER — ASPIRIN EC 81 MG PO TBEC
81.0000 mg | DELAYED_RELEASE_TABLET | Freq: Every day | ORAL | Status: DC
  Administered 2021-03-09: 81 mg via ORAL
  Filled 2021-03-09: qty 1

## 2021-03-09 MED ORDER — SODIUM CHLORIDE 0.9 % IV SOLN: 250.0000 mL | INTRAVENOUS | Status: DC | PRN

## 2021-03-09 MED ORDER — SENNOSIDES-DOCUSATE SODIUM 8.6-50 MG PO TABS: 1.0000 | ORAL_TABLET | Freq: Every evening | ORAL | Status: DC | PRN

## 2021-03-09 MED ORDER — VITAMIN D 25 MCG (1000 UNIT) PO TABS
1000.0000 [IU] | ORAL_TABLET | Freq: Every day | ORAL | Status: DC
  Administered 2021-03-09: 1000 [IU] via ORAL
  Filled 2021-03-09: qty 1

## 2021-03-09 MED ORDER — ACETAMINOPHEN 325 MG PO TABS
650.0000 mg | ORAL_TABLET | Freq: Four times a day (QID) | ORAL | Status: DC | PRN
  Administered 2021-03-09: 650 mg via ORAL
  Filled 2021-03-09: qty 2

## 2021-03-09 MED ORDER — SODIUM CHLORIDE 0.9% FLUSH
3.0000 mL | Freq: Two times a day (BID) | INTRAVENOUS | Status: DC
  Administered 2021-03-09: 3 mL via INTRAVENOUS

## 2021-03-09 MED ORDER — PANTOPRAZOLE SODIUM 40 MG PO TBEC
40.0000 mg | DELAYED_RELEASE_TABLET | Freq: Every day | ORAL | Status: DC
  Administered 2021-03-09: 40 mg via ORAL
  Filled 2021-03-09: qty 1

## 2021-03-09 MED ORDER — ASPIRIN 81 MG PO CHEW: 81.0000 mg | CHEWABLE_TABLET | Freq: Every day | ORAL | Status: DC

## 2021-03-09 MED ORDER — ATORVASTATIN CALCIUM 20 MG PO TABS
80.0000 mg | ORAL_TABLET | Freq: Every evening | ORAL | Status: DC
  Administered 2021-03-09: 80 mg via ORAL
  Filled 2021-03-09: qty 4

## 2021-03-09 MED ORDER — CLONAZEPAM 0.5 MG PO TBDP: 0.5000 mg | ORAL_TABLET | Freq: Every day | ORAL | Status: DC | PRN

## 2021-03-09 MED ORDER — SODIUM CHLORIDE 0.9 % IV BOLUS
1000.0000 mL | Freq: Once | INTRAVENOUS | Status: AC
  Administered 2021-03-09: 1000 mL via INTRAVENOUS

## 2021-03-09 MED ORDER — ENOXAPARIN SODIUM 60 MG/0.6ML IJ SOSY
0.5000 mg/kg | PREFILLED_SYRINGE | INTRAMUSCULAR | Status: DC
  Administered 2021-03-09: 57.5 mg via SUBCUTANEOUS
  Filled 2021-03-09: qty 0.6

## 2021-03-09 MED ORDER — DULOXETINE HCL 60 MG PO CPEP
120.0000 mg | ORAL_CAPSULE | Freq: Every day | ORAL | Status: DC
  Administered 2021-03-09: 120 mg via ORAL
  Filled 2021-03-09: qty 2

## 2021-03-09 NOTE — ED Provider Notes (Signed)
-----------------------------------------   7:05 AM on 03/09/2021 -----------------------------------------  Blood pressure 120/77, pulse (!) 123, temperature 97.6 F (36.4 C), temperature source Oral, resp. rate 20, height 5\' 7"  (1.702 m), weight 117 kg, last menstrual period 02/06/2021, SpO2 97 %.  Assuming care from Dr. 02/08/2021.  In short, Kelly [name] is a 45 y.o. female with a chief complaint of Near Syncope .  Refer to the original H&P for additional details.  The current plan of care is to follow-up labs and imaging for near syncope with long QT.  ----------------------------------------- 8:39 AM on 03/09/2021 ----------------------------------------- Labs are unremarkable, no significant electrolyte abnormality that could contribute to her long QT.  Patient does state that she was recently started on aripiprazole, which does have the potential to prolong her QT interval.  Given near syncope with associated tachycardia and prolonged QT, patient would benefit from admission for further assessment for arrhythmia.  Troponin within normal limits and I doubt ACS, will continue to trend.    03/11/2021, MD 03/09/21 [phone]

## 2021-03-09 NOTE — ED Provider Notes (Signed)
[facility] Emergency Department Provider Note   ____________________________________________   Event Date/Time   First MD Initiated Contact with Patient 03/09/21 [phone]     (approximate)  I have reviewed the triage vital signs and the nursing notes.   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  Reports generalized weakness for some time.  Denies recent fever, cough, chest pain, shortness of breath, abdominal pain, nausea, vomiting or diarrhea.  Reports good appetite.  Staff noted patient was orthostatic from sitting to standing by almost 60 points and pulse increased over 20 points.  Triage EKG demonstrates critically long QTC and patient was brought immediately back to a treatment room.     Past Medical History:  Diagnosis Date   Anxiety    Depression    GERD (gastroesophageal reflux disease)    Hyperlipemia    Myofascial pain syndrome     Patient Active Problem List   Diagnosis Date Noted   Chronic migraine without aura without status migrainosus, not intractable 04/30/2019   Migraine without aura and without status migrainosus, not intractable 02/05/2019   Medication overuse headache 02/05/2019    Past Surgical History:  Procedure Laterality Date   CESAREAN SECTION     CHOLECYSTECTOMY     TUBAL LIGATION      Prior to Admission medications   Medication Sig Start Date End Date Taking? Authorizing Provider  amitriptyline (ELAVIL) 25 MG tablet Take 25 mg by mouth at bedtime.    [provider]  baclofen (LIORESAL) 10 MG tablet Take 10 mg by mouth 3 (three) times daily.    [provider]  clonazePAM (KLONOPIN) 0.5 MG tablet Take 0.5 mg by mouth 2 (two) times daily as needed for anxiety.    [provider]  cyclobenzaprine (FLEXERIL) 5 MG tablet Take 1 tablet (5 mg total) by mouth 3 (three)  times daily as needed. 12/10/20   Menshew, Charlesetta Ivory, PA-C  DULoxetine (CYMBALTA) 30 MG capsule Take 90 mg by mouth daily.    [provider]  metoprolol tartrate (LOPRESSOR) 25 MG tablet Take 1 tablet (25 mg total) by mouth 2 (two) times daily. 07/28/20 02/21/21  Glyn Ade, Scot Jun, PA-C  pantoprazole (PROTONIX) 40 MG tablet Take 40 mg by mouth daily.    [provider]  predniSONE (DELTASONE) 5 MG tablet Take 6 tabs daily for 2 days, 5 tabs daily for 2 days, 4 tabs daily for 2 days, 3 tabs daily for 2 days, 2 tabs for 2 days, 1 tab daily for 2 days then stop. 01/01/21     Rimegepant Sulfate (NURTEC) 75 MG TBDP Take 75 mg by mouth as needed. 07/28/20   Glyn Ade, Scot Jun, PA-C  topiramate (TOPAMAX) 200 MG tablet Take 1 tablet (200 mg total) by mouth daily. 07/28/20   Bertram Denver, PA-C    Allergies Patient has no known allergies.  History reviewed. No pertinent family history.  Social History Social History   Tobacco Use   Smoking status: Never   Smokeless tobacco: Never  Vaping Use   Vaping Use: Never used  Substance Use Topics   Alcohol use: No   Drug use: No    Review of Systems  Constitutional: Positive for generalized weakness.  No fever/chills Eyes: No visual changes. ENT: No sore throat. Cardiovascular: Denies chest pain. Respiratory:  Denies shortness of breath. Gastrointestinal: No abdominal pain.  No nausea, no vomiting.  No diarrhea.  No constipation. Genitourinary: Negative for dysuria. Musculoskeletal: Negative for back pain. Skin: Negative for rash. Neurological: Positive for lightheadedness.  Negative for headaches, focal weakness or numbness.   ____________________________________________   PHYSICAL EXAM:  VITAL SIGNS: ED Triage Vitals  Enc Vitals Group     BP 03/09/21 0634 120/77     Pulse Rate 03/09/21 0634 (!) 123     Resp 03/09/21 0634 20     Temp 03/09/21 0634 97.6 F (36.4 C)     Temp Source 03/09/21 0634 Oral      SpO2 03/09/21 0634 97 %     Weight 03/09/21 0646 258 lb (117 kg)     Height 03/09/21 0646 5\' 7"  (1.702 m)     Head Circumference --      Peak Flow --      Pain Score 03/09/21 0646 0     Pain Loc --      Pain Edu? --      Excl. in GC? --     Constitutional: Alert and oriented.  Pale appearing and in mild acute distress. Eyes: Conjunctivae are normal. PERRL. EOMI. Head: Atraumatic. Nose: No congestion/rhinnorhea. Mouth/Throat: Mucous membranes are mildly dry.   Neck: No stridor.   Cardiovascular: Normal rate, regular rhythm. Grossly normal heart sounds.  Good peripheral circulation. Respiratory: Normal respiratory effort.  No retractions. Lungs CTAB. Gastrointestinal: Soft and nontender to light or deep palpation. No distention. No abdominal bruits. No CVA tenderness. Musculoskeletal: No lower extremity tenderness nor edema.  No joint effusions. Neurologic: Alert and oriented x3.  CN II to XII grossly intact.  Normal speech and language. No gross focal neurologic deficits are appreciated.  Skin:  Skin is pale, warm, dry and intact. No rash noted. Psychiatric: Mood and affect are normal. Speech and behavior are normal.  ____________________________________________   LABS (all labs ordered are listed, but only abnormal results are displayed)  Labs Reviewed  RESP PANEL BY RT-PCR (FLU A&B, COVID) ARPGX2  URINALYSIS, ROUTINE W REFLEX MICROSCOPIC  CBC WITH DIFFERENTIAL/PLATELET  COMPREHENSIVE METABOLIC PANEL  LIPASE, BLOOD  CBG MONITORING, ED  POC URINE PREG, ED  TROPONIN I (HIGH SENSITIVITY)   ____________________________________________  EKG  ED ECG REPORT I, Saudia Smyser J, the attending physician, personally viewed and interpreted this ECG.   Date: 03/09/2021  EKG Time: 0640  Rate: 135  Rhythm: sinus tachycardia  Axis: Normal  Intervals: QTC 573  ST&T Change: Nonspecific   Prior EKG 02/15/2021 unfortunately cannot be visualized.  EKG reports per [facility] office visit on  same date described as sinus bradycardia 56 bpm, low voltage in chest leads ____________________________________________  RADIOLOGY I, Fernandez Kenley J, personally viewed and evaluated these images (plain radiographs) as part of my medical decision making, as well as reviewing the written report by the radiologist.  ED MD interpretation: Pending  Official radiology report(s): No results found.  ____________________________________________   PROCEDURES  Procedure(s) performed (including Critical Care):  .1-3 Lead EKG Interpretation Performed by: [facility], MD Authorized by: Irean Hong, MD     Interpretation: abnormal     ECG rate:  135   ECG rate assessment: tachycardic     Rhythm: sinus tachycardia     Ectopy: none     Conduction: normal   Comments:     Patient placed on cardiac monitor to evaluate for arrhythmias   ____________________________________________   INITIAL IMPRESSION / ASSESSMENT AND PLAN /  ED COURSE  As part of my medical decision making, I reviewed the following data within the electronic MEDICAL RECORD NUMBER Nursing notes reviewed and incorporated, Labs reviewed, EKG interpreted, Old EKG reviewed, Old chart reviewed, Radiograph reviewed, and Notes from prior ED visits     45 year old female presenting with near syncope.  Differential diagnosis includes but is not limited to CVA, TIA, ACS, metabolic, infectious etiologies, etc.  Obtain cardiac panel, CT head, chest x-ray.  Administer IV fluid hydration.  Care is transferred to Dr. Larinda Buttery at change of shift pending lab work and likely hospitalization.      ____________________________________________   FINAL CLINICAL IMPRESSION(S) / ED DIAGNOSES  Final diagnoses:  Near syncope  Long QT interval     ED Discharge Orders     None        Note:  This document was prepared using Dragon voice recognition software and may include unintentional dictation errors.    Irean Hong, MD 03/09/21  [phone]

## 2021-03-09 NOTE — Consult Note (Signed)
[facility] Cardiology  CARDIOLOGY CONSULT NOTE  Patient ID: Kelly [name] MRN: [id number] DOB/AGE: November 01, 1975 45 y.o.  Admit date: 03/09/2021 Referring Physician Fritzi Mandes Primary Physician Jaclyn Prime, Collene Leyden, PA-C Primary Cardiologist Jaclyn Prime, Collene Leyden, PA-C Reason for Consultation Presyncope  HPI:  Kelly [name] is a 45 year old female with a history of anxiety, morbid obesity, atypical chest pain who was admitted to the hospital following an episode of presyncope.  Her QTC is quite prolonged prompting cardiology consultation.  She hasn't been feeling normal for a few days, feeling weak and short of breath. She was coming to work last night and had to take a knee because she was feeling quite short of breath. She felt dizzy as well and her BP was normal when sitting and dropped significantly (~ 40 pts systolic) with standing. Her HR was elevated the entire time.   Of note she has been seeing Dr. Dot Lanes for atypical chest pain; pain is under her left shoulder pain an in axilla and is worsened by exertion. She also has chronic shortness of breath x 6-8 weeks which is worsening. She had a NM stress which was normal. Had an echo on 02/28/21 which was normal.   She was started on abilify 3 weeks ago for depression. She also takes Elavil and Klonapin for depression/anxiety.   Social - Works for [employer] - Never smoker; household exposure - No alcohol or drugs  Family - Father had CVA in 29's, ICM. MI in 51's  Review of systems complete and found to be negative unless listed above     Past Medical History:  Diagnosis Date   Anxiety    Depression    GERD (gastroesophageal reflux disease)    Hyperlipemia    Myofascial pain syndrome     Past Surgical History:  Procedure Laterality Date   CESAREAN SECTION     CHOLECYSTECTOMY     TUBAL LIGATION      (Not in a hospital admission)  Social History   Socioeconomic History   Marital status: Single    Spouse name:  Not on file   Number of children: Not on file   Years of education: Not on file   Highest education level: Not on file  Occupational History   Not on file  Tobacco Use   Smoking status: Never   Smokeless tobacco: Never  Vaping Use   Vaping Use: Never used  Substance and Sexual Activity   Alcohol use: No   Drug use: No   Sexual activity: Yes    Birth control/protection: Surgical  Other Topics Concern   Not on file  Social History Narrative   Not on file   Social Determinants of Health   Financial Resource Strain: Not on file  Food Insecurity: Not on file  Transportation Needs: Not on file  Physical Activity: Not on file  Stress: Not on file  Social Connections: Not on file  Intimate Partner Violence: Not on file    History reviewed. No pertinent family history.    Review of systems complete and found to be negative unless listed above      PHYSICAL EXAM  General: Well developed, well nourished, in no acute distress HEENT:  Normocephalic and atramatic Neck:  No JVD.  Lungs: Clear bilaterally to auscultation and percussion. Heart: HRRR . Normal S1 and S2 without gallops or murmurs.  Abdomen: Bowel sounds are positive, abdomen soft and non-tender  Msk:  Back normal, normal gait. Normal strength and tone for age.  Extremities: No clubbing, cyanosis or edema.   Neuro: Alert and oriented X 3. Psych:  Good affect, responds appropriately  Labs:   Lab Results  Component Value Date   WBC 5.5 03/09/2021   HGB 12.7 03/09/2021   HCT 39.6 03/09/2021   MCV 86.7 03/09/2021   PLT 283 03/09/2021    Recent Labs  Lab 03/09/21 0647  NA 136  K 3.5  CL 109  CO2 20*  BUN 15  CREATININE 1.04*  CALCIUM 9.6  PROT 7.8  BILITOT 0.5  ALKPHOS 75  ALT 17  AST 18  GLUCOSE 119*   No results found for: CKTOTAL, CKMB, CKMBINDEX, TROPONINI No results found for: CHOL No results found for: HDL No results found for: LDLCALC No results found for: TRIG No results found for:  CHOLHDL No results found for: LDLDIRECT    Radiology: CT Head Wo Contrast  Result Date: 03/09/2021 CLINICAL DATA:  45 year old female with near syncope. Hypertensive, tachycardic. Weakness. EXAM: CT HEAD WITHOUT CONTRAST TECHNIQUE: Contiguous axial images were obtained from the base of the skull through the vertex without intravenous contrast. COMPARISON:  None. FINDINGS: Brain: Normal cerebral volume. No midline shift, ventriculomegaly, mass effect, evidence of mass lesion, intracranial hemorrhage or evidence of cortically based acute infarction. Gray-white matter differentiation is within normal limits throughout the brain. Vascular: No suspicious intracranial vascular hyperdensity. Skull: Negative. Sinuses/Orbits: Visualized paranasal sinuses and mastoids are clear. Tympanic cavities are clear. Other: Visualized orbits and scalp soft tissues are within normal limits. IMPRESSION: Normal noncontrast Head CT. Electronically Signed   By: Odessa Fleming M.D.   On: 03/09/2021 07:34   DG Chest Port 1 View  Result Date: 03/09/2021 CLINICAL DATA:  45 year old female with near syncope. Hypertensive, tachycardic. Weakness. EXAM: PORTABLE CHEST 1 VIEW COMPARISON:  CT chest 08/28/2015. FINDINGS: Portable AP upright view at 0704 hours. Normal lung volumes and mediastinal contours. Visualized tracheal air column is within normal limits. Questionable small irregular 7-8 mm right upper lung pulmonary nodule. Elsewhere Allowing for portable technique the lungs are clear. No pneumothorax or pleural effusion. No acute osseous abnormality identified. Paucity of bowel gas in the upper abdomen. IMPRESSION: Negative portable chest aside from a questionable right upper lobe pulmonary nodule. Routine follow-up Chest CT would best evaluate further and could be compared to that from 2017. Electronically Signed   By: Odessa Fleming M.D.   On: 03/09/2021 07:32    EKG: Sinus rhythm with prolonged QTc  Echo 02/28/21- Summary    1. The left  ventricle is normal in size with normal wall thickness.    2. The left ventricular systolic function is normal, LVEF is visually  estimated at > 55%.    3. The right ventricle is normal in size, with normal systolic function.    4. There are no significant valvular abnormalities.   NM spect 02/07/21- Impressions:  - No significant ischemia is noted on perfusion imaging  - There is a small in size, subtle in severity, reverse defect involving  the mid anteroseptal segment.  This is consistent with probable artifact  and less likely due to ischemia.  - There is a small in size, moderate in severity, fixed defect involving  the apical lateral segment.  This is consistent with probable artifact.  - The patient exercised under supervision for a total of 3 min(s), 0  sec(s) achieving stage 2 (7.0 METS) of the Bruce protocol.  - Post stress:  Global systolic function is normal.  The ejection fraction  was  greater than 65%.  - Sensitivity and specificity of this test are reduced by the noted  attenuation, BMI >40.  - In the future, for larger patients or those with attenuation issues,  consider ordering PET rubidium study.   ASSESSMENT AND PLAN:  Koren Pavlock is a 45 year old female with a history of anxiety, morbid obesity, atypical chest pain who was admitted to the hospital following an episode of presyncope.  Her QTC is quite prolonged prompting cardiology consultation.  # Presyncope # Shortness of breath # Prolonged Qtc She presents with several weeks of shortness of breath, and an episode of presyncope this morning while coming to work.  She has undergone a work-up with [facility] cardiology including a negative stress test and fairly normal echocardiogram in the last 2 weeks.  Her symptoms are somewhat nonspecific, but on presentation she has a prolonged QTC which may or may not be related.  She was recently started on Abilify which we will need to hold. -Discontinue Abilify; EKG is now  normalized with a QTC of 423 ms -Supplement K greater than 4, magnesium greater than 2 -Would not repeat echocardiogram at this time -Check D-dimer to rule out possibility of pulmonary embolism given her presentation of tachycardia and shortness of breath. -Maintain on telemetry -At discharge would recommend outpatient Holter monitor with her primary cardiologist   Signed: Andrez Grime MD 03/09/2021, 9:20 AM

## 2021-03-09 NOTE — H&P (Signed)
[facility] [place] at [facility]   PATIENT NAME: Kelly [name]    MR#:  [id number]  DATE OF BIRTH:  04-08-1976  DATE OF ADMISSION:  03/09/2021  PRIMARY CARE PHYSICIAN: Glyn Ade, Scot Jun, PA-C   REQUESTING/REFERRING PHYSICIAN: Dr Larinda Buttery  Patient coming from :home/work   CHIEF COMPLAINT:  felt dizzy and weak  HISTORY OF PRESENT ILLNESS:  Kelly [name]  is a 45 y.o. female with a known history of depression, anxiety, Genella Rife, migraine headaches comes to the emergency room with complaints of dizziness, weak and some shortness of breath. Patient works as a Electrical engineer at [employer]. She got to work and felt weak. She got staff to check her blood pressure and blood pressure was low while standing up. She felt dizzy lightheaded was brought to the ER.  ED course: patient was reported to be orthostatic in the ER she received IV fluid. Denies any chest pain or shortness of breath at present. She was also found to have prolonged QTC interval of 573 on initial EKG. Patient tells me she was started on Abilify 3 weeks ago by her primary psychiatrist. She is being admitted for further evaluation and management PAST MEDICAL HISTORY:   Past Medical History:  Diagnosis Date   Anxiety    Depression    GERD (gastroesophageal reflux disease)    Hyperlipemia    Myofascial pain syndrome     PAST SURGICAL HISTOIRY:   Past Surgical History:  Procedure Laterality Date   CESAREAN SECTION     CHOLECYSTECTOMY     TUBAL LIGATION      SOCIAL HISTORY:   Social History   Tobacco Use   Smoking status: Never   Smokeless tobacco: Never  Substance Use Topics   Alcohol use: No    FAMILY HISTORY:  History reviewed. No pertinent family history.  DRUG ALLERGIES:  No Known Allergies  REVIEW OF SYSTEMS:  Review of Systems  Constitutional:  Negative for chills, fever and weight loss.  HENT:  Negative for ear discharge, ear pain and nosebleeds.    Eyes:  Negative for blurred vision, pain and discharge.  Respiratory:  Negative for sputum production, shortness of breath, wheezing and stridor.   Cardiovascular:  Negative for chest pain, palpitations, orthopnea and PND.  Gastrointestinal:  Negative for abdominal pain, diarrhea, nausea and vomiting.  Genitourinary:  Negative for frequency and urgency.  Musculoskeletal:  Negative for back pain and joint pain.  Neurological:  Negative for sensory change, speech change, focal weakness and weakness.  Psychiatric/Behavioral:  Negative for depression and hallucinations. The patient is not nervous/anxious.     MEDICATIONS AT HOME:   Prior to Admission medications   Medication Sig Start Date End Date Taking? Authorizing Provider  ARIPiprazole (ABILIFY) 5 MG tablet Take 5 mg by mouth at bedtime.   Yes [provider]  cholecalciferol (VITAMIN D3) 25 MCG (1000 UNIT) tablet Take 1,000 Units by mouth daily.   Yes [provider]  clonazePAM (KLONOPIN) 0.5 MG tablet Take 0.25-0.5 mg by mouth daily as needed for anxiety.   Yes [provider]  DULoxetine (CYMBALTA) 60 MG capsule Take 120 mg by mouth at bedtime.   Yes [provider]  pantoprazole (PROTONIX) 40 MG tablet Take 40 mg by mouth at bedtime.   Yes [provider]  prazosin (MINIPRESS) 2 MG capsule Take 4-6 mg by mouth at bedtime.   Yes [provider]  Rimegepant Sulfate (NURTEC) 75 MG TBDP Take 75 mg by  mouth as needed. Patient taking differently: Take 75 mg by mouth as needed (migraine). 07/28/20  Yes Teague Edwena Blow, PA-C  topiramate (TOPAMAX) 200 MG tablet Take 1 tablet (200 mg total) by mouth daily. Patient taking differently: Take 200 mg by mouth at bedtime. 07/28/20  Yes Teague Edwena Blow, PA-C  aspirin 81 MG chewable tablet Chew 81 mg by mouth daily.    [provider]  atorvastatin (LIPITOR) 80 MG tablet Take 80 mg by mouth every evening.    [provider]   metoprolol tartrate (LOPRESSOR) 25 MG tablet Take 1 tablet (25 mg total) by mouth 2 (two) times daily. 07/28/20 02/21/21  Glyn Ade, Scot Jun, PA-C      VITAL SIGNS:  Blood pressure 114/76, pulse 73, temperature 97.6 F (36.4 C), temperature source Oral, resp. rate 16, height 5\' 7"  (1.702 m), weight 117 kg, last menstrual period 02/06/2021, SpO2 100 %.  PHYSICAL EXAMINATION:  GENERAL:  45 y.o.-year-old patient lying in the bed with no acute distress.  EYES: Pupils equal, round, reactive to light and accommodation. No scleral icterus.  HEENT: Head atraumatic, normocephalic. Oropharynx and nasopharynx clear.  NECK:  Supple, no jugular venous distention. No thyroid enlargement, no tenderness.  LUNGS: Normal breath sounds bilaterally, no wheezing, rales,rhonchi or crepitation. No use of accessory muscles of respiration.  CARDIOVASCULAR: S1, S2 normal. No murmurs, rubs, or gallops.  ABDOMEN: Soft, nontender, nondistended. Bowel sounds present. No organomegaly or mass.  EXTREMITIES: No pedal edema, cyanosis, or clubbing.  NEUROLOGIC: Cranial nerves II through XII are intact. Muscle strength 5/5 in all extremities. Sensation intact. Gait not checked.  PSYCHIATRIC: The patient is alert and oriented x 3.  SKIN: No obvious rash, lesion, or ulcer.   LABORATORY PANEL:   CBC Recent Labs  Lab 03/09/21 0647  WBC 5.5  HGB 12.7  HCT 39.6  PLT 283   ------------------------------------------------------------------------------------------------------------------  Chemistries  Recent Labs  Lab 03/09/21 0647 03/09/21 1230  NA 136  --   K 3.5  --   CL 109  --   CO2 20*  --   GLUCOSE 119*  --   BUN 15  --   CREATININE 1.04*  --   CALCIUM 9.6  --   MG 1.9 2.0  AST 18  --   ALT 17  --   ALKPHOS 75  --   BILITOT 0.5  --    ------------------------------------------------------------------------------------------------------------------  Cardiac Enzymes No results for input(s):  TROPONINI in the last 168 hours. ------------------------------------------------------------------------------------------------------------------  RADIOLOGY:  CT Head Wo Contrast  Result Date: 03/09/2021 CLINICAL DATA:  45 year old female with near syncope. Hypertensive, tachycardic. Weakness. EXAM: CT HEAD WITHOUT CONTRAST TECHNIQUE: Contiguous axial images were obtained from the base of the skull through the vertex without intravenous contrast. COMPARISON:  None. FINDINGS: Brain: Normal cerebral volume. No midline shift, ventriculomegaly, mass effect, evidence of mass lesion, intracranial hemorrhage or evidence of cortically based acute infarction. Gray-white matter differentiation is within normal limits throughout the brain. Vascular: No suspicious intracranial vascular hyperdensity. Skull: Negative. Sinuses/Orbits: Visualized paranasal sinuses and mastoids are clear. Tympanic cavities are clear. Other: Visualized orbits and scalp soft tissues are within normal limits. IMPRESSION: Normal noncontrast Head CT. Electronically Signed   By: 55 M.D.   On: 03/09/2021 07:34   DG Chest Port 1 View  Result Date: 03/09/2021 CLINICAL DATA:  45 year old female with near syncope. Hypertensive, tachycardic. Weakness. EXAM: PORTABLE CHEST 1 VIEW COMPARISON:  CT chest 08/28/2015. FINDINGS: Portable AP upright view at  0704 hours. Normal lung volumes and mediastinal contours. Visualized tracheal air column is within normal limits. Questionable small irregular 7-8 mm right upper lung pulmonary nodule. Elsewhere Allowing for portable technique the lungs are clear. No pneumothorax or pleural effusion. No acute osseous abnormality identified. Paucity of bowel gas in the upper abdomen. IMPRESSION: Negative portable chest aside from a questionable right upper lobe pulmonary nodule. Routine follow-up Chest CT would best evaluate further and could be compared to that from 2017. Electronically Signed   By: Odessa Fleming M.D.    On: 03/09/2021 07:32    EKG:    IMPRESSION AND PLAN:   Kelly [name]  is a 45 y.o. female with a known history of depression, anxiety, Genella Rife, migraine headaches comes to the emergency room with complaints of dizziness, weak and some shortness of breath.  Near syncope with shortness of breath and prolonged QTc interval on EKG -- patient recently had workup done at [facility] with normal echo and normal myoview stress test -- cardiac markers negative -- D dimer 0.48 -- repeat EKG shows QTc interval of 473 -- cardiology consultation with [email or web address]-- recommends discontinue abilify, at discharge would recommend outpatient Holter monitor by her primary cardiologist at [facility]  Anxiety/ depression -- continue PRN Klonopin -- continue Cymbalta  Gerd PPI  morbid obesity  Family Communication :none Consults :[facility] cardiology Code Status :full DVT prophylaxis :lovenox Level of care: Telemetry Medical  TOTAL TIME TAKING CARE OF THIS PATIENT: 45 minutes.    Enedina Finner M.D  [facility]     CC: Primary care physician; Glyn Ade, Scot Jun, PA-C

## 2021-03-09 NOTE — Progress Notes (Signed)
*  PRELIMINARY RESULTS* Echocardiogram 2D Echocardiogram has been performed.  Kelly [name] 03/09/2021, 12:54 PM

## 2021-03-09 NOTE — ED Triage Notes (Signed)
Pt c/o feeling weak for several weeks.

## 2021-03-09 NOTE — Progress Notes (Signed)
Cross Cover  Patient requested Vit D and lipitor off home med list to restart

## 2021-03-09 NOTE — ED Triage Notes (Signed)
FIRST NURSE NOTE:  Pt had clocked into work, and dropped to a knee, c/o pain to knee, pt reported not feeling weak and unable to get up without assistance, staff checked her BP and noted her sitting BP was 137/94 and upon standing BP was 86/33, pulse ranged from 115-140s after checking in.

## 2021-03-10 DIAGNOSIS — R55 Syncope and collapse: Secondary | ICD-10-CM | POA: Diagnosis not present

## 2021-03-10 NOTE — Discharge Summary (Signed)
[facility] [place] [facility]   PATIENT NAME: Kelly [name]    MR#:  [id number]  DATE OF BIRTH:  [birth year]  DATE OF ADMISSION:  03/09/2021 ADMITTING PHYSICIAN: Enedina Finner, MD  DATE OF DISCHARGE: 03/10/2021  PRIMARY CARE PHYSICIAN: Glyn Ade, Scot Jun, PA-C    ADMISSION DIAGNOSIS:  Near syncope [R55]  DISCHARGE DIAGNOSIS:  Near syncope Prolong QTc--improving suspected drug (abilify) related  SECONDARY DIAGNOSIS:   Past Medical History:  Diagnosis Date   Anxiety    Depression    GERD (gastroesophageal reflux disease)    Hyperlipemia    Myofascial pain syndrome     HOSPITAL COURSE:   Kelly [name]  is a 45 y.o. female with a known history of depression, anxiety, Genella Rife, migraine headaches comes to the emergency room with complaints of dizziness, weak and some shortness of breath.   Near syncope with shortness of breath and prolonged QTc interval on EKG -- patient recently had workup done at [facility] with normal echo and normal myoview stress test -- cardiac markers negative -- D dimer 0.48 -- repeat EKG shows QTc interval of 473 -- cardiology consultation with [email or web address]-- recommends discontinue abilify, at discharge would recommend outpatient Holter monitor by her primary cardiologist at [facility]-- pt aware   Anxiety/ depression -- continue PRN Klonopin -- continue Cymbalta   Gerd PPI   morbid obesity  Overall stable. Pt requesting to go home. Will d/c home with outpt  f/u [facility] cardiology CONSULTS OBTAINED:  Treatment Team:  Armando Reichert, MD  DRUG ALLERGIES:  No Known Allergies  DISCHARGE MEDICATIONS:   Allergies as of 03/10/2021   No Known Allergies      Medication List     STOP taking these medications    ARIPiprazole 5 MG tablet Commonly known as: ABILIFY   metoprolol tartrate 25 MG tablet Commonly known as: LOPRESSOR       TAKE these medications    aspirin 81 MG chewable tablet Chew 81 mg by mouth daily.    atorvastatin 80 MG tablet Commonly known as: LIPITOR Take 80 mg by mouth every evening.   cholecalciferol 25 MCG (1000 UNIT) tablet Commonly known as: VITAMIN D3 Take 1,000 Units by mouth daily.   clonazePAM 0.5 MG tablet Commonly known as: KLONOPIN Take 0.25-0.5 mg by mouth daily as needed for anxiety.   DULoxetine 60 MG capsule Commonly known as: CYMBALTA Take 120 mg by mouth at bedtime.   Nurtec 75 MG Tbdp Generic drug: Rimegepant Sulfate Take 75 mg by mouth as needed. What changed: reasons to take this   pantoprazole 40 MG tablet Commonly known as: PROTONIX Take 40 mg by mouth at bedtime.   prazosin 2 MG capsule Commonly known as: MINIPRESS Take 4-6 mg by mouth at bedtime.   topiramate 200 MG tablet Commonly known as: Topamax Take 1 tablet (200 mg total) by mouth daily. What changed: when to take this        If you experience worsening of your admission symptoms, develop shortness of breath, life threatening emergency, suicidal or homicidal thoughts you must seek medical attention immediately by calling 911 or calling your MD immediately  if symptoms less severe.  You Must read complete instructions/literature along with all the possible adverse reactions/side effects for all the Medicines you take and that have been prescribed to you. Take any new Medicines after you have completely understood and accept all the possible adverse reactions/side effects.   Please note  You were cared for by a  hospitalist during your hospital stay. If you have any questions about your discharge medications or the care you received while you were in the hospital after you are discharged, you can call the unit and asked to speak with the hospitalist on call if the hospitalist that took care of you is not available. Once you are discharged, your primary care physician will handle any further medical issues. Please note that NO REFILLS for any discharge medications will be authorized once  you are discharged, as it is imperative that you return to your primary care physician (or establish a relationship with a primary care physician if you do not have one) for your aftercare needs so that they can reassess your need for medications and monitor your lab values. Today   SUBJECTIVE   No new complaints  VITAL SIGNS:  Blood pressure 113/75, pulse 81, temperature 98.4 F (36.9 C), temperature source Oral, resp. rate 19, height 5\' 7"  (1.702 m), weight 117 kg, last menstrual period 02/06/2021, SpO2 99 %.  I/O:   Intake/Output Summary (Last 24 hours) at 03/10/2021 0832 Last data filed at 03/09/2021 2000 Gross per 24 hour  Intake 1240 ml  Output --  Net 1240 ml    PHYSICAL EXAMINATION:  GENERAL:  45 y.o.-year-old patient lying in the bed with no acute distress. Obese LUNGS: Normal breath sounds bilaterally, no wheezing, rales,rhonchi or crepitation. No use of accessory muscles of respiration.  CARDIOVASCULAR: S1, S2 normal. No murmurs, rubs, or gallops.  ABDOMEN: Soft, non-tender, non-distended. Bowel sounds present. No organomegaly or mass.  EXTREMITIES: No pedal edema, cyanosis, or clubbing.  NEUROLOGIC: non-focal PSYCHIATRIC:  patient is alert and oriented x 3.  SKIN: No obvious rash, lesion, or ulcer.   DATA REVIEW:   CBC  Recent Labs  Lab 03/09/21 0647  WBC 5.5  HGB 12.7  HCT 39.6  PLT 283    Chemistries  Recent Labs  Lab 03/09/21 0647 03/09/21 1230  NA 136  --   K 3.5  --   CL 109  --   CO2 20*  --   GLUCOSE 119*  --   BUN 15  --   CREATININE 1.04*  --   CALCIUM 9.6  --   MG 1.9 2.0  AST 18  --   ALT 17  --   ALKPHOS 75  --   BILITOT 0.5  --     Microbiology Results   Recent Results (from the past 240 hour(s))  Resp Panel by RT-PCR (Flu A&B, Covid) Nasopharyngeal Swab     Status: None   Collection Time: 03/09/21  6:57 AM   Specimen: Nasopharyngeal Swab; Nasopharyngeal(NP) swabs in vial transport medium  Result Value Ref Range Status    SARS Coronavirus 2 by RT PCR NEGATIVE NEGATIVE Final    Comment: (NOTE) SARS-CoV-2 target nucleic acids are NOT DETECTED.  The SARS-CoV-2 RNA is generally detectable in upper respiratory specimens during the acute phase of infection. The lowest concentration of SARS-CoV-2 viral copies this assay can detect is 138 copies/mL. A negative result does not preclude SARS-Cov-2 infection and should not be used as the sole basis for treatment or other patient management decisions. A negative result may occur with  improper specimen collection/handling, submission of specimen other than nasopharyngeal swab, presence of viral mutation(s) within the areas targeted by this assay, and inadequate number of viral copies(<138 copies/mL). A negative result must be combined with clinical observations, patient history, and epidemiological information. The expected result is Negative.  Fact Sheet  for Patients:  BloggerCourse.com  Fact Sheet for Healthcare Providers:  SeriousBroker.it  This test is no t yet approved or cleared by the [place] FDA and  has been authorized for detection and/or diagnosis of SARS-CoV-2 by FDA under an Emergency Use Authorization (EUA). This EUA will remain  in effect (meaning this test can be used) for the duration of the COVID-19 declaration under Section 564(b)(1) of the Act, 21 U.S.C.section 360bbb-3(b)(1), unless the authorization is terminated  or revoked sooner.       Influenza A by PCR NEGATIVE NEGATIVE Final   Influenza B by PCR NEGATIVE NEGATIVE Final    Comment: (NOTE) The Xpert Xpress SARS-CoV-2/FLU/RSV plus assay is intended as an aid in the diagnosis of influenza from Nasopharyngeal swab specimens and should not be used as a sole basis for treatment. Nasal washings and aspirates are unacceptable for Xpert Xpress SARS-CoV-2/FLU/RSV testing.  Fact Sheet for  Patients: BloggerCourse.com  Fact Sheet for Healthcare Providers: SeriousBroker.it  This test is not yet approved or cleared by the [place] FDA and has been authorized for detection and/or diagnosis of SARS-CoV-2 by FDA under an Emergency Use Authorization (EUA). This EUA will remain in effect (meaning this test can be used) for the duration of the COVID-19 declaration under Section 564(b)(1) of the Act, 21 U.S.C. section 360bbb-3(b)(1), unless the authorization is terminated or revoked.  Performed at [facility], [address], [place], [place] [address]     RADIOLOGY:  CT Head Wo Contrast  Result Date: 03/09/2021 CLINICAL DATA:  45 year old female with near syncope. Hypertensive, tachycardic. Weakness. EXAM: CT HEAD WITHOUT CONTRAST TECHNIQUE: Contiguous axial images were obtained from the base of the skull through the vertex without intravenous contrast. COMPARISON:  None. FINDINGS: Brain: Normal cerebral volume. No midline shift, ventriculomegaly, mass effect, evidence of mass lesion, intracranial hemorrhage or evidence of cortically based acute infarction. Gray-white matter differentiation is within normal limits throughout the brain. Vascular: No suspicious intracranial vascular hyperdensity. Skull: Negative. Sinuses/Orbits: Visualized paranasal sinuses and mastoids are clear. Tympanic cavities are clear. Other: Visualized orbits and scalp soft tissues are within normal limits. IMPRESSION: Normal noncontrast Head CT. Electronically Signed   By: Odessa Fleming M.D.   On: 03/09/2021 07:34   DG Chest Port 1 View  Result Date: 03/09/2021 CLINICAL DATA:  45 year old female with near syncope. Hypertensive, tachycardic. Weakness. EXAM: PORTABLE CHEST 1 VIEW COMPARISON:  CT chest 08/28/2015. FINDINGS: Portable AP upright view at 0704 hours. Normal lung volumes and mediastinal contours. Visualized tracheal air column is within  normal limits. Questionable small irregular 7-8 mm right upper lung pulmonary nodule. Elsewhere Allowing for portable technique the lungs are clear. No pneumothorax or pleural effusion. No acute osseous abnormality identified. Paucity of bowel gas in the upper abdomen. IMPRESSION: Negative portable chest aside from a questionable right upper lobe pulmonary nodule. Routine follow-up Chest CT would best evaluate further and could be compared to that from 2017. Electronically Signed   By: Odessa Fleming M.D.   On: 03/09/2021 07:32   ECHOCARDIOGRAM COMPLETE  Result Date: 03/09/2021    ECHOCARDIOGRAM REPORT   Patient Name:   Kelly [name] Date of Exam: 03/09/2021 Medical Rec #:  [id number]          Height:       67.0 in Accession #:    [id number]         Weight:       258.0 lb Date of Birth:  06/15/1975  BSA:          2.253 m Patient Age:    45 years           BP:           129/76 mmHg Patient Gender: F                  HR:           73 bpm. Exam Location:  [facility] Procedure: 2D Echo, Color Doppler and Cardiac Doppler Indications:     R94.31 Abnormal ECG  History:         Patient has no prior history of Echocardiogram examinations.                  Risk Factors:Dyslipidemia.  Sonographer:     Humphrey Rolls Referring Phys:  [id number] Kelly [name] Diagnosing Phys: Kelly [name]  Sonographer Comments: Suboptimal subcostal window. IMPRESSIONS  1. Left ventricular ejection fraction, by estimation, is 60 to 65%. The left ventricle has normal function. The left ventricle has no regional wall motion abnormalities. Left ventricular diastolic parameters were normal.  2. Right ventricular systolic function is normal. The right ventricular size is not well visualized.  3. The mitral valve is normal in structure. No evidence of mitral valve regurgitation. No evidence of mitral stenosis.  4. The aortic valve is normal in structure. Aortic valve regurgitation is not visualized. No aortic stenosis is present. FINDINGS  Left Ventricle: Left  ventricular ejection fraction, by estimation, is 60 to 65%. The left ventricle has normal function. The left ventricle has no regional wall motion abnormalities. The left ventricular internal cavity size was normal in size. There is  no left ventricular hypertrophy. Left ventricular diastolic parameters were normal. Right Ventricle: The right ventricular size is not well visualized. No increase in right ventricular wall thickness. Right ventricular systolic function is normal. Left Atrium: Left atrial size was normal in size. Right Atrium: Right atrial size was normal in size. Pericardium: There is no evidence of pericardial effusion. Mitral Valve: The mitral valve is normal in structure. No evidence of mitral valve regurgitation. No evidence of mitral valve stenosis. MV peak gradient, 2.3 mmHg. The mean mitral valve gradient is 1.0 mmHg. Tricuspid Valve: The tricuspid valve is normal in structure. Tricuspid valve regurgitation is not demonstrated. Aortic Valve: The aortic valve is normal in structure. Aortic valve regurgitation is not visualized. No aortic stenosis is present. Aortic valve mean gradient measures 2.0 mmHg. Aortic valve peak gradient measures 3.9 mmHg. Aortic valve area, by VTI measures 3.89 cm. Pulmonic Valve: The pulmonic valve was normal in structure. Pulmonic valve regurgitation is not visualized. Aorta: The aortic root is normal in size and structure. IAS/Shunts: The interatrial septum was not assessed.  LEFT VENTRICLE PLAX 2D LVIDd:         4.91 cm   Diastology LVIDs:         3.31 cm   LV e' medial:    12.10 cm/s LV PW:         0.87 cm   LV E/e' medial:  5.9 LV IVS:        0.74 cm   LV e' lateral:   13.20 cm/s LVOT diam:     2.20 cm   LV E/e' lateral: 5.4 LV SV:         67 LV SV Index:   30 LVOT Area:     3.80 cm  RIGHT VENTRICLE RV Basal diam:  2.97 cm LEFT ATRIUM  Index        RIGHT ATRIUM           Index LA diam:        3.60 cm 1.60 cm/m   RA Area:     14.50 cm LA Vol (A2C):    26.7 ml 11.85 ml/m  RA Volume:   34.20 ml  15.18 ml/m LA Vol (A4C):   26.4 ml 11.72 ml/m LA Biplane Vol: 27.2 ml 12.07 ml/m  AORTIC VALVE                    PULMONIC VALVE AV Area (Vmax):    3.66 cm     PV Vmax:       1.00 m/s AV Area (Vmean):   3.16 cm     PV Vmean:      70.300 cm/s AV Area (VTI):     3.89 cm     PV VTI:        0.206 m AV Vmax:           99.00 cm/s   PV Peak grad:  4.0 mmHg AV Vmean:          70.600 cm/s  PV Mean grad:  2.0 mmHg AV VTI:            0.171 m AV Peak Grad:      3.9 mmHg AV Mean Grad:      2.0 mmHg LVOT Vmax:         95.30 cm/s LVOT Vmean:        58.700 cm/s LVOT VTI:          0.175 m LVOT/AV VTI ratio: 1.02  AORTA Ao Root diam: 2.90 cm MITRAL VALVE MV Area (PHT): 3.48 cm    SHUNTS MV Area VTI:   3.31 cm    Systemic VTI:  0.18 m MV Peak grad:  2.3 mmHg    Systemic Diam: 2.20 cm MV Mean grad:  1.0 mmHg MV Vmax:       0.76 m/s MV Vmean:      54.9 cm/s MV Decel Time: 218 msec MV E velocity: 71.10 cm/s MV A velocity: 45.80 cm/s MV E/A ratio:  1.55 Kelly [name] Electronically signed by Kelly [name] Signature Date/Time: 03/09/2021/3:32:47 PM    Final      CODE STATUS:     Code Status Orders  (From admission, onward)           Start     Ordered   03/09/21 0918  Full code  Continuous        03/09/21 0918           Code Status History     This patient has a current code status but no historical code status.        TOTAL TIME TAKING CARE OF THIS PATIENT: 40 minutes.    Enedina Finner M.D  [facility]    CC: Primary care physician; Glyn Ade, Scot Jun, PA-C

## 2021-03-10 NOTE — Discharge Instructions (Signed)
Pt to f/u [facility] cardiology and discuss about Holter monitor for home

## 2021-03-13 DIAGNOSIS — R55 Syncope and collapse: Secondary | ICD-10-CM | POA: Diagnosis not present

## 2021-03-28 DIAGNOSIS — F411 Generalized anxiety disorder: Secondary | ICD-10-CM | POA: Diagnosis not present

## 2021-04-02 DIAGNOSIS — R55 Syncope and collapse: Secondary | ICD-10-CM | POA: Diagnosis not present

## 2021-04-03 DIAGNOSIS — F411 Generalized anxiety disorder: Secondary | ICD-10-CM | POA: Diagnosis not present

## 2021-04-04 DIAGNOSIS — I471 Supraventricular tachycardia: Secondary | ICD-10-CM | POA: Diagnosis not present

## 2021-04-09 ENCOUNTER — Encounter (HOSPITAL_COMMUNITY): Payer: Self-pay | Admitting: Radiology

## 2021-04-09 ENCOUNTER — Other Ambulatory Visit: Payer: Self-pay

## 2021-04-11 DIAGNOSIS — F411 Generalized anxiety disorder: Secondary | ICD-10-CM | POA: Diagnosis not present

## 2021-04-17 DIAGNOSIS — R0602 Shortness of breath: Secondary | ICD-10-CM | POA: Diagnosis not present

## 2021-04-17 DIAGNOSIS — R55 Syncope and collapse: Secondary | ICD-10-CM | POA: Diagnosis not present

## 2021-04-17 DIAGNOSIS — R911 Solitary pulmonary nodule: Secondary | ICD-10-CM | POA: Diagnosis not present

## 2021-04-17 DIAGNOSIS — R9431 Abnormal electrocardiogram [ECG] [EKG]: Secondary | ICD-10-CM | POA: Diagnosis not present

## 2021-04-26 DIAGNOSIS — J8410 Pulmonary fibrosis, unspecified: Secondary | ICD-10-CM | POA: Diagnosis not present

## 2021-04-26 DIAGNOSIS — R911 Solitary pulmonary nodule: Secondary | ICD-10-CM | POA: Diagnosis not present

## 2021-04-26 DIAGNOSIS — K449 Diaphragmatic hernia without obstruction or gangrene: Secondary | ICD-10-CM | POA: Diagnosis not present

## 2021-04-26 DIAGNOSIS — I2510 Atherosclerotic heart disease of native coronary artery without angina pectoris: Secondary | ICD-10-CM | POA: Diagnosis not present

## 2021-04-30 DIAGNOSIS — F411 Generalized anxiety disorder: Secondary | ICD-10-CM | POA: Diagnosis not present

## 2021-05-09 DIAGNOSIS — F411 Generalized anxiety disorder: Secondary | ICD-10-CM | POA: Diagnosis not present

## 2021-05-16 ENCOUNTER — Ambulatory Visit: Payer: 59

## 2021-05-16 ENCOUNTER — Encounter: Payer: Self-pay | Admitting: Family Medicine

## 2021-05-16 ENCOUNTER — Ambulatory Visit (INDEPENDENT_AMBULATORY_CARE_PROVIDER_SITE_OTHER): Payer: 59 | Admitting: Family Medicine

## 2021-05-16 ENCOUNTER — Other Ambulatory Visit: Payer: Self-pay

## 2021-05-16 VITALS — BP 124/84 | HR 79 | Wt 259.0 lb

## 2021-05-16 DIAGNOSIS — N921 Excessive and frequent menstruation with irregular cycle: Secondary | ICD-10-CM

## 2021-05-16 DIAGNOSIS — Z23 Encounter for immunization: Secondary | ICD-10-CM

## 2021-05-16 DIAGNOSIS — N870 Mild cervical dysplasia: Secondary | ICD-10-CM | POA: Insufficient documentation

## 2021-05-16 DIAGNOSIS — N920 Excessive and frequent menstruation with regular cycle: Secondary | ICD-10-CM | POA: Insufficient documentation

## 2021-05-16 NOTE — Assessment & Plan Note (Signed)
Cycles are longer and heavier. She desires a more definitive process and possibly a reduction in cervical cancer risk. Discussed hysterectomy, and lesser alternatives including medications, IUD, endometrial ablation. Discussed R/B/A of all of the above, including need for on-going surveillance of HPV at the vagina, time out of work, risks of surgery. She was given written and verbal  information for consideration. After she has decided on her best course of action, she will message via MyChart for attempt at surgical scheduling. She is leaning toward endometrial ablation vs. TVH with possible salpingectomy.

## 2021-05-16 NOTE — Progress Notes (Signed)
° °  Subjective:    Patient ID: Kelly [name] is a 46 y.o. female presenting with No chief complaint on file.  on 05/16/2021  HPI: Has h/o abnormal paps with + HR HPV 18/45. She is getting gardasil. She also has heavier cycles and desires a more definitive procedure. Her mom had a hysterectomy at 105. She is s/p SVD x 1 and then LTCS for [place] x 1 with BTL.  Review of Systems  Constitutional:  Negative for chills and fever.  Respiratory:  Negative for shortness of breath.   Cardiovascular:  Negative for chest pain.  Gastrointestinal:  Negative for abdominal pain, nausea and vomiting.  Genitourinary:  Negative for dysuria.  Skin:  Negative for rash.     Objective:    BP 124/84    Pulse 79    Wt 259 lb (117.5 kg)    LMP 05/06/2021    BMI 40.57 kg/m  Physical Exam Exam conducted with a chaperone present.  Constitutional:      General: She is not in acute distress.    Appearance: She is well-developed.  HENT:     Head: Normocephalic and atraumatic.  Eyes:     General: No scleral icterus. Cardiovascular:     Rate and Rhythm: Normal rate.  Pulmonary:     Effort: Pulmonary effort is normal.  Abdominal:     Palpations: Abdomen is soft.  Musculoskeletal:     Cervical back: Neck supple.  Skin:    General: Skin is warm and dry.  Neurological:     Mental Status: She is alert and oriented to person, place, and time.        Assessment & Plan:   Problem List Items Addressed This Visit       Unprioritized   Menorrhagia    Cycles are longer and heavier. She desires a more definitive process and possibly a reduction in cervical cancer risk. Discussed hysterectomy, and lesser alternatives including medications, IUD, endometrial ablation. Discussed R/B/A of all of the above, including need for on-going surveillance of HPV at the vagina, time out of work, risks of surgery. She was given written and verbal  information for consideration. After she has decided on her best course of  action, she will message via MyChart for attempt at surgical scheduling. She is leaning toward endometrial ablation vs. TVH with possible salpingectomy.      Dysplasia of cervix, low grade (CIN 1)    Will need continued surveillance.      Other Visit Diagnoses     Need for HPV vaccine    -  Primary   Relevant Orders   HPV 9-valent vaccine,Recombinat (Completed)      Return in about 2 months (around 07/14/2021) for postop check.  [name], MD 05/16/2021 10:37 AM

## 2021-05-16 NOTE — Assessment & Plan Note (Signed)
Will need continued surveillance.

## 2021-05-23 DIAGNOSIS — F411 Generalized anxiety disorder: Secondary | ICD-10-CM | POA: Diagnosis not present

## 2021-05-26 ENCOUNTER — Encounter: Payer: Self-pay | Admitting: Radiology

## 2021-05-30 DIAGNOSIS — F411 Generalized anxiety disorder: Secondary | ICD-10-CM | POA: Diagnosis not present

## 2021-06-01 ENCOUNTER — Other Ambulatory Visit: Payer: Self-pay

## 2021-06-01 DIAGNOSIS — Z6841 Body Mass Index (BMI) 40.0 and over, adult: Secondary | ICD-10-CM | POA: Diagnosis not present

## 2021-06-01 DIAGNOSIS — R1310 Dysphagia, unspecified: Secondary | ICD-10-CM | POA: Diagnosis not present

## 2021-06-01 DIAGNOSIS — R10819 Abdominal tenderness, unspecified site: Secondary | ICD-10-CM | POA: Diagnosis not present

## 2021-06-01 DIAGNOSIS — K219 Gastro-esophageal reflux disease without esophagitis: Secondary | ICD-10-CM | POA: Diagnosis not present

## 2021-06-01 DIAGNOSIS — R102 Pelvic and perineal pain: Secondary | ICD-10-CM | POA: Diagnosis not present

## 2021-06-01 MED ORDER — DICYCLOMINE HCL 20 MG PO TABS
ORAL_TABLET | ORAL | 3 refills | Status: DC
  Filled 2021-06-01 – 2021-06-19 (×2): qty 30, 7d supply, fill #0

## 2021-06-11 DIAGNOSIS — F411 Generalized anxiety disorder: Secondary | ICD-10-CM | POA: Diagnosis not present

## 2021-06-14 ENCOUNTER — Other Ambulatory Visit: Payer: Self-pay

## 2021-06-19 ENCOUNTER — Other Ambulatory Visit: Payer: Self-pay

## 2021-06-21 DIAGNOSIS — F411 Generalized anxiety disorder: Secondary | ICD-10-CM | POA: Diagnosis not present

## 2021-06-27 DIAGNOSIS — F411 Generalized anxiety disorder: Secondary | ICD-10-CM | POA: Diagnosis not present

## 2021-06-29 ENCOUNTER — Other Ambulatory Visit: Payer: Self-pay

## 2021-06-29 ENCOUNTER — Emergency Department: Payer: PRIVATE HEALTH INSURANCE

## 2021-06-29 ENCOUNTER — Emergency Department
Admission: EM | Admit: 2021-06-29 | Discharge: 2021-06-29 | Disposition: A | Discharge status: Home / Self Care | Payer: PRIVATE HEALTH INSURANCE | Attending: Emergency Medicine | Admitting: Emergency Medicine

## 2021-06-29 ENCOUNTER — Encounter: Payer: Self-pay | Admitting: Emergency Medicine

## 2021-06-29 DIAGNOSIS — Y900 Blood alcohol level of less than 20 mg/100 ml: Secondary | ICD-10-CM | POA: Diagnosis not present

## 2021-06-29 DIAGNOSIS — M79604 Pain in right leg: Secondary | ICD-10-CM | POA: Diagnosis not present

## 2021-06-29 DIAGNOSIS — R1031 Right lower quadrant pain: Secondary | ICD-10-CM | POA: Diagnosis not present

## 2021-06-29 DIAGNOSIS — R52 Pain, unspecified: Secondary | ICD-10-CM

## 2021-06-29 DIAGNOSIS — S76211A Strain of adductor muscle, fascia and tendon of right thigh, initial encounter: Secondary | ICD-10-CM | POA: Diagnosis not present

## 2021-06-29 MED ORDER — NAPROXEN 500 MG PO TABS
500.0000 mg | ORAL_TABLET | Freq: Two times a day (BID) | ORAL | 0 refills | Status: DC
  Filled 2021-06-29: qty 30, 15d supply, fill #0

## 2021-06-29 MED ORDER — METHOCARBAMOL 500 MG PO TABS
500.0000 mg | ORAL_TABLET | Freq: Four times a day (QID) | ORAL | 0 refills | Status: DC | PRN
  Filled 2021-06-29: qty 30, 8d supply, fill #0

## 2021-06-29 NOTE — Discharge Instructions (Signed)
Ice 20 minutes per hour while awake for the next few days. ? ?Follow up with [facility] on Monday. ? ?Return to the ER for symptoms that change or worsen if unable to schedule an appointment. ?
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
## 2021-07-02 DIAGNOSIS — F411 Generalized anxiety disorder: Secondary | ICD-10-CM | POA: Diagnosis not present

## 2021-07-04 ENCOUNTER — Other Ambulatory Visit: Payer: Self-pay

## 2021-07-11 ENCOUNTER — Other Ambulatory Visit: Payer: Self-pay

## 2021-07-11 DIAGNOSIS — F411 Generalized anxiety disorder: Secondary | ICD-10-CM | POA: Diagnosis not present

## 2021-07-13 ENCOUNTER — Other Ambulatory Visit: Payer: Self-pay

## 2021-07-19 DIAGNOSIS — K219 Gastro-esophageal reflux disease without esophagitis: Secondary | ICD-10-CM | POA: Diagnosis not present

## 2021-07-19 DIAGNOSIS — R1310 Dysphagia, unspecified: Secondary | ICD-10-CM | POA: Diagnosis not present

## 2021-07-19 DIAGNOSIS — K449 Diaphragmatic hernia without obstruction or gangrene: Secondary | ICD-10-CM | POA: Diagnosis not present

## 2021-07-23 DIAGNOSIS — F411 Generalized anxiety disorder: Secondary | ICD-10-CM | POA: Diagnosis not present

## 2021-07-27 DIAGNOSIS — R1310 Dysphagia, unspecified: Secondary | ICD-10-CM | POA: Diagnosis not present

## 2021-07-27 DIAGNOSIS — R109 Unspecified abdominal pain: Secondary | ICD-10-CM | POA: Diagnosis not present

## 2021-07-27 DIAGNOSIS — R195 Other fecal abnormalities: Secondary | ICD-10-CM | POA: Diagnosis not present

## 2021-07-27 DIAGNOSIS — R634 Abnormal weight loss: Secondary | ICD-10-CM | POA: Diagnosis not present

## 2021-07-27 DIAGNOSIS — F411 Generalized anxiety disorder: Secondary | ICD-10-CM | POA: Diagnosis not present

## 2021-08-02 DIAGNOSIS — F411 Generalized anxiety disorder: Secondary | ICD-10-CM | POA: Diagnosis not present

## 2021-08-07 DIAGNOSIS — F411 Generalized anxiety disorder: Secondary | ICD-10-CM | POA: Diagnosis not present

## 2021-08-14 ENCOUNTER — Encounter: Payer: 59 | Admitting: Physician Assistant

## 2021-08-20 DIAGNOSIS — F331 Major depressive disorder, recurrent, moderate: Secondary | ICD-10-CM | POA: Diagnosis not present

## 2021-08-20 DIAGNOSIS — Z1389 Encounter for screening for other disorder: Secondary | ICD-10-CM | POA: Diagnosis not present

## 2021-08-20 DIAGNOSIS — F4312 Post-traumatic stress disorder, chronic: Secondary | ICD-10-CM | POA: Diagnosis not present

## 2021-08-20 DIAGNOSIS — F411 Generalized anxiety disorder: Secondary | ICD-10-CM | POA: Diagnosis not present

## 2021-08-20 DIAGNOSIS — Z79899 Other long term (current) drug therapy: Secondary | ICD-10-CM | POA: Diagnosis not present

## 2021-08-30 DIAGNOSIS — F411 Generalized anxiety disorder: Secondary | ICD-10-CM | POA: Diagnosis not present

## 2021-09-05 DIAGNOSIS — F411 Generalized anxiety disorder: Secondary | ICD-10-CM | POA: Diagnosis not present

## 2021-09-13 DIAGNOSIS — F411 Generalized anxiety disorder: Secondary | ICD-10-CM | POA: Diagnosis not present

## 2021-09-14 ENCOUNTER — Ambulatory Visit (INDEPENDENT_AMBULATORY_CARE_PROVIDER_SITE_OTHER): Payer: 59 | Admitting: Physician Assistant

## 2021-09-14 ENCOUNTER — Encounter: Payer: Self-pay | Admitting: Physician Assistant

## 2021-09-14 ENCOUNTER — Other Ambulatory Visit: Payer: Self-pay

## 2021-09-14 VITALS — BP 119/80 | HR 97 | Wt 249.6 lb

## 2021-09-14 DIAGNOSIS — G43009 Migraine without aura, not intractable, without status migrainosus: Secondary | ICD-10-CM | POA: Diagnosis not present

## 2021-09-14 DIAGNOSIS — G4733 Obstructive sleep apnea (adult) (pediatric): Secondary | ICD-10-CM | POA: Insufficient documentation

## 2021-09-14 MED ORDER — TOPIRAMATE 200 MG PO TABS
200.0000 mg | ORAL_TABLET | Freq: Every day | ORAL | 11 refills | Status: DC
  Filled 2021-09-14: qty 30, 30d supply, fill #0
  Filled 2021-10-13 – 2021-11-16 (×2): qty 30, 30d supply, fill #1
  Filled 2021-12-27: qty 30, 30d supply, fill #2
  Filled 2022-02-25: qty 30, 30d supply, fill #3
  Filled 2022-04-22: qty 30, 30d supply, fill #4
  Filled 2022-05-22 – 2022-06-05 (×2): qty 30, 30d supply, fill #5
  Filled 2022-07-01: qty 30, 30d supply, fill #6
  Filled 2022-09-10: qty 30, 30d supply, fill #7

## 2021-09-14 MED ORDER — NURTEC 75 MG PO TBDP
75.0000 mg | ORAL_TABLET | ORAL | 11 refills | Status: DC | PRN
Start: 2021-09-14 — End: 2022-10-18
  Filled 2021-09-14: qty 8, 30d supply, fill #0
  Filled 2021-10-13: qty 8, 30d supply, fill #1
  Filled 2021-11-16: qty 8, 30d supply, fill #2
  Filled 2021-12-27: qty 8, 30d supply, fill #3
  Filled 2022-01-29: qty 8, 30d supply, fill #4
  Filled 2022-02-25: qty 8, 30d supply, fill #5
  Filled 2022-03-20: qty 8, 30d supply, fill #6
  Filled 2022-04-22 – 2022-04-25 (×2): qty 8, 30d supply, fill #7
  Filled 2022-05-22: qty 8, 30d supply, fill #8
  Filled 2022-06-05: qty 8, 16d supply, fill #8
  Filled 2022-07-01: qty 8, 16d supply, fill #9
  Filled 2022-09-10: qty 8, 16d supply, fill #10

## 2021-09-14 NOTE — Patient Instructions (Signed)

## 2021-09-14 NOTE — Progress Notes (Signed)
History:  Kelly [name] is a 46 y.o. [id number] who presents to clinic today for annual headache evaluation.  Last year she had a sleep study that showed sleep apnea.  Pt was to receive CPAP but that never happened.  She finally gave up on getting it.  She is fine with this and not interested in pursuing at this time.  Her headaches are fairly well controlled now.  She is using Topamax 200mg  qd for prevention and nurtec for acute management.   She notes she discontinued her depression meds for about 6 months.  During this time she started losing weight and has lost about 50#.  She restarted about 6 weeks ago and has continued to lose.  She is seeing her psychiatrist regularly and her therapist weekly.   Number of days in the last 4 weeks with:  Severe headache: 1  Moderate headache: 2 Mild headache: 5  No headache: 20   Past Medical History:  Diagnosis Date   Anxiety    Depression    GERD (gastroesophageal reflux disease)    Hyperlipemia    Myofascial pain syndrome     Social History   Socioeconomic History   Marital status: Single    Spouse name: Not on file   Number of children: Not on file   Years of education: Not on file   Highest education level: Not on file  Occupational History   Not on file  Tobacco Use   Smoking status: Never   Smokeless tobacco: Never  Vaping Use   Vaping Use: Never used  Substance and Sexual Activity   Alcohol use: No   Drug use: No   Sexual activity: Yes    Birth control/protection: Surgical  Other Topics Concern   Not on file  Social History Narrative   Not on file   Social Determinants of Health   Financial Resource Strain: Not on file  Food Insecurity: Not on file  Transportation Needs: Not on file  Physical Activity: Not on file  Stress: Not on file  Social Connections: Not on file  Intimate Partner Violence: Not on file    No family history on file.  No Known Allergies  Current Outpatient Medications on File Prior to  Visit  Medication Sig Dispense Refill   aspirin 81 MG chewable tablet Chew 81 mg by mouth daily. (Patient not taking: Reported on 05/16/2021)     atorvastatin (LIPITOR) 80 MG tablet Take 80 mg by mouth every evening. (Patient not taking: Reported on 05/16/2021)     cholecalciferol (VITAMIN D3) 25 MCG (1000 UNIT) tablet Take 1,000 Units by mouth daily. (Patient not taking: Reported on 05/16/2021)     clonazePAM (KLONOPIN) 0.5 MG tablet Take 0.25-0.5 mg by mouth daily as needed for anxiety. (Patient not taking: Reported on 05/16/2021)     dicyclomine (BENTYL) 20 MG tablet Take 1 tablet (20 mg total) by mouth every six (6) hours as needed. 30 tablet 3   DULoxetine (CYMBALTA) 60 MG capsule Take 120 mg by mouth at bedtime. (Patient not taking: Reported on 05/16/2021)     methocarbamol (ROBAXIN) 500 MG tablet Take 1 tablet (500 mg total) by mouth every 6 (six) hours as needed for muscle spasms. 30 tablet 0   naproxen (NAPROSYN) 500 MG tablet Take 1 tablet (500 mg total) by mouth 2 (two) times daily with a meal. 30 tablet 0   pantoprazole (PROTONIX) 40 MG tablet Take 40 mg by mouth at bedtime. (Patient not taking: Reported on 05/16/2021)  prazosin (MINIPRESS) 2 MG capsule Take 4-6 mg by mouth at bedtime. (Patient not taking: Reported on 05/16/2021)     Rimegepant Sulfate (NURTEC) 75 MG TBDP Take 75 mg by mouth as needed. (Patient not taking: Reported on 05/16/2021) 8 tablet 11   topiramate (TOPAMAX) 200 MG tablet Take 1 tablet (200 mg total) by mouth daily. (Patient not taking: Reported on 05/16/2021) 30 tablet 11   No current facility-administered medications on file prior to visit.     Review of Systems:  All pertinent positive/negative included in HPI, all other review of systems are negative   Objective:  Physical Exam BP 119/80   Pulse 97   Wt 249 lb 9.6 oz (113.2 kg)   BMI 39.09 kg/m  CONSTITUTIONAL: Well-developed, well-nourished female in no acute distress.  EYES: EOM intact ENT:  Normocephalic CARDIOVASCULAR: Regular rate  RESPIRATORY: Normal rate. MUSCULOSKELETAL: Normal ROM SKIN: Warm, dry without erythema  NEUROLOGICAL: Alert, oriented, CN II-XII grossly intact, Appropriate balance, PSYCH: Normal behavior, mood   Assessment & Plan:  Assessment: 1. Migraine without aura and without status migrainosus, not intractable   2. OSA (obstructive sleep apnea)      Plan: Continue TOpamax 200mg  qd prevention Nurtec 75mg  prn migraine Continue on depression medications and f/u with psych as directed Encouraged to see PCP and reconsider CPAP as I believe it would help many facets of overall health.   Follow-up in 12 months or sooner PRN   Total time spent in chart review, and face to face with patient: 33 minutes 09/14/2021 9:03 AM'

## 2021-09-24 ENCOUNTER — Encounter: Payer: Self-pay | Admitting: *Deleted

## 2021-09-27 DIAGNOSIS — F411 Generalized anxiety disorder: Secondary | ICD-10-CM | POA: Diagnosis not present

## 2021-10-01 DIAGNOSIS — F331 Major depressive disorder, recurrent, moderate: Secondary | ICD-10-CM | POA: Diagnosis not present

## 2021-10-01 DIAGNOSIS — F411 Generalized anxiety disorder: Secondary | ICD-10-CM | POA: Diagnosis not present

## 2021-10-01 DIAGNOSIS — F4312 Post-traumatic stress disorder, chronic: Secondary | ICD-10-CM | POA: Diagnosis not present

## 2021-10-03 DIAGNOSIS — F411 Generalized anxiety disorder: Secondary | ICD-10-CM | POA: Diagnosis not present

## 2021-10-10 DIAGNOSIS — F411 Generalized anxiety disorder: Secondary | ICD-10-CM | POA: Diagnosis not present

## 2021-10-13 DIAGNOSIS — Z1231 Encounter for screening mammogram for malignant neoplasm of breast: Secondary | ICD-10-CM

## 2021-10-14 ENCOUNTER — Other Ambulatory Visit: Payer: Self-pay

## 2021-10-15 ENCOUNTER — Other Ambulatory Visit: Payer: Self-pay

## 2021-10-17 DIAGNOSIS — F411 Generalized anxiety disorder: Secondary | ICD-10-CM | POA: Diagnosis not present

## 2021-10-24 ENCOUNTER — Other Ambulatory Visit: Payer: Self-pay

## 2021-10-25 DIAGNOSIS — F411 Generalized anxiety disorder: Secondary | ICD-10-CM | POA: Diagnosis not present

## 2021-10-31 DIAGNOSIS — F411 Generalized anxiety disorder: Secondary | ICD-10-CM | POA: Diagnosis not present

## 2021-11-07 DIAGNOSIS — F411 Generalized anxiety disorder: Secondary | ICD-10-CM | POA: Diagnosis not present

## 2021-11-12 DIAGNOSIS — F411 Generalized anxiety disorder: Secondary | ICD-10-CM | POA: Diagnosis not present

## 2021-11-18 ENCOUNTER — Other Ambulatory Visit: Payer: Self-pay

## 2021-11-19 ENCOUNTER — Other Ambulatory Visit: Payer: Self-pay

## 2021-11-21 DIAGNOSIS — F411 Generalized anxiety disorder: Secondary | ICD-10-CM | POA: Diagnosis not present

## 2021-11-26 DIAGNOSIS — F411 Generalized anxiety disorder: Secondary | ICD-10-CM | POA: Diagnosis not present

## 2021-11-26 DIAGNOSIS — F4312 Post-traumatic stress disorder, chronic: Secondary | ICD-10-CM | POA: Diagnosis not present

## 2021-11-26 DIAGNOSIS — F331 Major depressive disorder, recurrent, moderate: Secondary | ICD-10-CM | POA: Diagnosis not present

## 2021-11-27 DIAGNOSIS — F411 Generalized anxiety disorder: Secondary | ICD-10-CM | POA: Diagnosis not present

## 2021-12-05 DIAGNOSIS — F411 Generalized anxiety disorder: Secondary | ICD-10-CM | POA: Diagnosis not present

## 2021-12-11 ENCOUNTER — Ambulatory Visit
Admission: RE | Admit: 2021-12-11 | Discharge: 2021-12-11 | Disposition: A | Discharge status: Home / Self Care | Payer: 59 | Source: Ambulatory Visit | Attending: Family Medicine | Admitting: Family Medicine

## 2021-12-11 DIAGNOSIS — F411 Generalized anxiety disorder: Secondary | ICD-10-CM | POA: Diagnosis not present

## 2021-12-11 DIAGNOSIS — Z1231 Encounter for screening mammogram for malignant neoplasm of breast: Secondary | ICD-10-CM | POA: Diagnosis not present

## 2021-12-25 DIAGNOSIS — F411 Generalized anxiety disorder: Secondary | ICD-10-CM | POA: Diagnosis not present

## 2021-12-27 ENCOUNTER — Other Ambulatory Visit: Payer: Self-pay

## 2022-01-02 DIAGNOSIS — F411 Generalized anxiety disorder: Secondary | ICD-10-CM | POA: Diagnosis not present

## 2022-01-07 DIAGNOSIS — F411 Generalized anxiety disorder: Secondary | ICD-10-CM | POA: Diagnosis not present

## 2022-01-17 DIAGNOSIS — F411 Generalized anxiety disorder: Secondary | ICD-10-CM | POA: Diagnosis not present

## 2022-01-28 DIAGNOSIS — F411 Generalized anxiety disorder: Secondary | ICD-10-CM | POA: Diagnosis not present

## 2022-01-29 ENCOUNTER — Other Ambulatory Visit: Payer: Self-pay

## 2022-02-04 ENCOUNTER — Other Ambulatory Visit (HOSPITAL_COMMUNITY)
Admission: RE | Admit: 2022-02-04 | Discharge: 2022-02-04 | Disposition: A | Discharge status: Home / Self Care | Payer: 59 | Source: Ambulatory Visit | Attending: Family Medicine | Admitting: Family Medicine

## 2022-02-04 ENCOUNTER — Encounter: Payer: Self-pay | Admitting: Family Medicine

## 2022-02-04 ENCOUNTER — Ambulatory Visit (INDEPENDENT_AMBULATORY_CARE_PROVIDER_SITE_OTHER): Payer: 59 | Admitting: Family Medicine

## 2022-02-04 VITALS — BP 140/92 | HR 109 | Wt 252.0 lb

## 2022-02-04 DIAGNOSIS — Z3202 Encounter for pregnancy test, result negative: Secondary | ICD-10-CM | POA: Diagnosis not present

## 2022-02-04 DIAGNOSIS — N870 Mild cervical dysplasia: Secondary | ICD-10-CM | POA: Insufficient documentation

## 2022-02-04 DIAGNOSIS — Z124 Encounter for screening for malignant neoplasm of cervix: Secondary | ICD-10-CM

## 2022-02-04 DIAGNOSIS — N926 Irregular menstruation, unspecified: Secondary | ICD-10-CM | POA: Diagnosis not present

## 2022-02-04 DIAGNOSIS — N921 Excessive and frequent menstruation with irregular cycle: Secondary | ICD-10-CM | POA: Diagnosis not present

## 2022-02-04 DIAGNOSIS — Z01419 Encounter for gynecological examination (general) (routine) without abnormal findings: Secondary | ICD-10-CM | POA: Diagnosis not present

## 2022-02-04 LAB — POCT URINE PREGNANCY: Preg Test, Ur: NEGATIVE

## 2022-02-04 NOTE — Progress Notes (Signed)
3 weeks late on her cycle

## 2022-02-04 NOTE — Progress Notes (Signed)
   GYNECOLOGY ANNUAL PREVENTATIVE CARE ENCOUNTER NOTE  Subjective:   Kelly [name] is a 46 y.o. G50P2012 female here for a routine annual gynecologic exam.  Current complaints: Reports she is 3 weeks late and is never late with period. She reports associated nipple tenderness.   Denies abnormal vaginal bleeding, discharge, pelvic pain, problems with intercourse or other gynecologic concerns.    Gynecologic History Patient's last menstrual period was 12/17/2021 (exact date). Contraception: tubal ligation Last Pap: 2022. Results were: ASCUS, HPV POS, Colpo=CIN1 Last mammogram: 2023. Results were: normal  Health Maintenance Due  Topic Date Due   COVID-19 Vaccine (1) Never done   Hepatitis C Screening  Never done   TETANUS/TDAP  Never done   HPV VACCINES (3 - 3-dose SCDM series) 09/13/2021   INFLUENZA VACCINE  Never done   PAP SMEAR-Modifier  12/27/2021    The following portions of the patient's history were reviewed and updated as appropriate: allergies, current medications, past family history, past medical history, past social history, past surgical history and problem list.  Review of Systems Pertinent items are noted in HPI.   Objective:  BP (!) 140/92   Pulse (!) 109   Wt 252 lb (114.3 kg)   LMP 12/17/2021 (Exact Date)   BMI 39.47 kg/m  CONSTITUTIONAL: Well-developed, well-nourished female in no acute distress.  HENT:  Normocephalic, atraumatic, External right and left ear normal. Oropharynx is clear and moist EYES:  No scleral icterus.  NECK: Normal range of motion, supple, no masses.  Normal thyroid.  SKIN: Skin is warm and dry. No rash noted. Not diaphoretic. No erythema. No pallor. NEUROLOGIC: Alert and oriented to person, place, and time. Normal reflexes, muscle tone coordination. No cranial nerve deficit noted. PSYCHIATRIC: Normal mood and affect. Normal behavior. Normal judgment and thought content. CARDIOVASCULAR: Normal heart rate noted, regular rhythm. 2+  distal pulses. RESPIRATORY: Effort and breath sounds normal, no problems with respiration noted. BREASTS: Symmetric in size. No masses, skin changes, nipple drainage, or lymphadenopathy. ABDOMEN: Soft,  no distention noted.  No tenderness, rebound or guarding.  PELVIC: Normal appearing external genitalia; normal appearing vaginal mucosa and cervix.  No abnormal discharge noted.  Pap smear obtained.  Normal uterine size, no other palpable masses, no uterine or adnexal tenderness. MUSCULOSKELETAL: Normal range of motion.    Assessment and Plan:  1) Annual gynecologic examination with pap smear:  Will follow up results of pap smear and manage accordingly. Routine preventative health maintenance measures emphasized.   1. Menorrhagia with irregular cycle - Follicle stimulating hormone - LH  2. Cervical cancer screening  3. Dysplasia of cervix, low grade (CIN 1) - Cytology - PAP  4. Irregular periods - UPT negative today - Follicle stimulating hormone - LH   Please refer to After Visit Summary for other counseling recommendations.   Return in about 1 year (around 02/05/2023) for Yearly wellness exam.  Caren Macadam, MD, MPH, ABFM Attending Physician Center for [facility]

## 2022-02-05 LAB — LUTEINIZING HORMONE: LH: 12.4 m[IU]/mL

## 2022-02-05 LAB — FOLLICLE STIMULATING HORMONE: FSH: 3.2 m[IU]/mL

## 2022-02-07 LAB — CYTOLOGY - PAP
Comment: NEGATIVE
Comment: NEGATIVE
Comment: NEGATIVE
HPV 16: NEGATIVE
HPV 18 / 45: POSITIVE — AB
High risk HPV: POSITIVE — AB

## 2022-02-25 ENCOUNTER — Other Ambulatory Visit: Payer: Self-pay

## 2022-02-28 ENCOUNTER — Encounter: Payer: Self-pay | Admitting: Obstetrics and Gynecology

## 2022-02-28 ENCOUNTER — Ambulatory Visit (INDEPENDENT_AMBULATORY_CARE_PROVIDER_SITE_OTHER): Payer: 59 | Admitting: Obstetrics and Gynecology

## 2022-02-28 ENCOUNTER — Other Ambulatory Visit (HOSPITAL_COMMUNITY)
Admission: RE | Admit: 2022-02-28 | Discharge: 2022-02-28 | Disposition: A | Discharge status: Home / Self Care | Payer: 59 | Source: Ambulatory Visit | Attending: Obstetrics and Gynecology | Admitting: Obstetrics and Gynecology

## 2022-02-28 VITALS — BP 116/79 | HR 98 | Wt 252.0 lb

## 2022-02-28 DIAGNOSIS — N870 Mild cervical dysplasia: Secondary | ICD-10-CM

## 2022-02-28 DIAGNOSIS — R87810 Cervical high risk human papillomavirus (HPV) DNA test positive: Secondary | ICD-10-CM | POA: Diagnosis not present

## 2022-02-28 DIAGNOSIS — N888 Other specified noninflammatory disorders of cervix uteri: Secondary | ICD-10-CM | POA: Diagnosis not present

## 2022-02-28 HISTORY — PX: COLPOSCOPY W/ BIOPSY / CURETTAGE: SUR283

## 2022-02-28 NOTE — Procedures (Addendum)
Colposcopy Procedure Note  Pre-operative Diagnosis:  102/2023 pap: lsil, HPV 18/45+ 02/2021 colpo: cin 1 12/2020 pap: LSIL, HPV 18/45+  Post-operative Diagnosis: cin 1  Procedure Details  No sex since last period.  The risks (including infection, bleeding, pain) and benefits of the procedure were explained to the patient and written informed consent was obtained.  The patient was placed in the dorsal lithotomy position. A Graves was speculum inserted in the vagina, and the cervix was visualized.  Acetic acid staining was done and the cervix was viewed with green filter; lugol's staining with green filter was also done .  Biopsy from 6 and 12 o'clock and then an endocervical curettage in all four quadrants done. There was no bleeding after procedure.   Findings: diffuse AWE changes circumferentially at the TZ. On Lugol's staining, I saw a large segment (approximatley 1cm wide and 2cm in length) of AWE changes going anterior at 12 o'clock  Adequate: Yes  Specimens: 6 o'clock and 12 o'clock and endocervical curettage (all sent separately  Condition: Stable  Complications: None  Plan: Patient also denies any tobacco use or 2nd hand smoke exposure.  The patient was advised to call for any fever or for prolonged or severe pain or bleeding. She was advised to use OTC analgesics as needed for mild to moderate pain. She was advised to do pelvic rest x 7 days  Patient interested in hysterectomy with Dr. Shawnie Pons, as previously discussed. Will forward results to Dr. Shawnie Pons for next steps.    Cornelia Copa MD Attending Center for [employer] [facility])

## 2022-02-28 NOTE — Progress Notes (Signed)
RGYN patient presents for Colpo  Last pap:02/04/22 LSIL +HPV  18/45  LMP: 02/07/22.  Denies any unprotected intercourse in last 14 days  Not currently sexually active per pt.   Pt used rest room in lobby.

## 2022-03-04 LAB — SURGICAL PATHOLOGY

## 2022-03-05 DIAGNOSIS — F411 Generalized anxiety disorder: Secondary | ICD-10-CM | POA: Diagnosis not present

## 2022-03-06 ENCOUNTER — Encounter: Payer: Self-pay | Admitting: Family Medicine

## 2022-03-20 DIAGNOSIS — F4312 Post-traumatic stress disorder, chronic: Secondary | ICD-10-CM | POA: Diagnosis not present

## 2022-03-20 DIAGNOSIS — F411 Generalized anxiety disorder: Secondary | ICD-10-CM | POA: Diagnosis not present

## 2022-03-20 DIAGNOSIS — F331 Major depressive disorder, recurrent, moderate: Secondary | ICD-10-CM | POA: Diagnosis not present

## 2022-03-21 ENCOUNTER — Other Ambulatory Visit: Payer: Self-pay

## 2022-03-28 DIAGNOSIS — F411 Generalized anxiety disorder: Secondary | ICD-10-CM | POA: Diagnosis not present

## 2022-04-02 DIAGNOSIS — F411 Generalized anxiety disorder: Secondary | ICD-10-CM | POA: Diagnosis not present

## 2022-04-23 ENCOUNTER — Other Ambulatory Visit: Payer: Self-pay

## 2022-04-24 ENCOUNTER — Other Ambulatory Visit (HOSPITAL_COMMUNITY)
Admission: RE | Admit: 2022-04-24 | Discharge: 2022-04-24 | Disposition: A | Discharge status: Home / Self Care | Payer: Commercial Managed Care - PPO | Source: Ambulatory Visit | Attending: Family Medicine | Admitting: Family Medicine

## 2022-04-24 ENCOUNTER — Ambulatory Visit (INDEPENDENT_AMBULATORY_CARE_PROVIDER_SITE_OTHER): Payer: Commercial Managed Care - PPO | Admitting: Family Medicine

## 2022-04-24 ENCOUNTER — Encounter: Payer: Self-pay | Admitting: Family Medicine

## 2022-04-24 VITALS — BP 125/80 | HR 109 | Wt 252.0 lb

## 2022-04-24 DIAGNOSIS — N939 Abnormal uterine and vaginal bleeding, unspecified: Secondary | ICD-10-CM | POA: Diagnosis not present

## 2022-04-24 DIAGNOSIS — N870 Mild cervical dysplasia: Secondary | ICD-10-CM | POA: Diagnosis not present

## 2022-04-24 DIAGNOSIS — F411 Generalized anxiety disorder: Secondary | ICD-10-CM | POA: Diagnosis not present

## 2022-04-24 NOTE — Progress Notes (Signed)
Patient here to discuss Hysterectomy.   LMP: 04/08/22 lasted 7 days Moderate to  heavy flow with lots of clots.   Notes painful cramping 6/10x when on cycle.

## 2022-04-24 NOTE — Assessment & Plan Note (Signed)
EMB today. Schedule for u/s. Book for [facility]. Risks include but are not limited to bleeding, infection, injury to surrounding structures, including bowel, bladder, especially with prior c-section x 1, and ureters, blood clots, and death.  Likelihood of success is high.

## 2022-04-24 NOTE — Assessment & Plan Note (Signed)
Given her on-going surveillance--discussed risk and decreased due to cervix removal, but will need on-going surveillance of the vagina due to this.

## 2022-04-24 NOTE — Progress Notes (Signed)
   Subjective:    Patient ID: Kelly [name] is a 47 y.o. female presenting with No chief complaint on file.  on 04/24/2022  HPI: Here to discuss hysterectomy. Still having heavy cycles. Lasting about a week. Has h/o SVD x 1 and C-s x 1 with BTL. Has ongoing abnormal paps with + HPV 18/45. Colpo shows CIN1 with negative ECC.  Review of Systems  Constitutional:  Negative for chills and fever.  Respiratory:  Negative for shortness of breath.   Cardiovascular:  Negative for chest pain.  Gastrointestinal:  Negative for abdominal pain, nausea and vomiting.  Genitourinary:  Negative for dysuria.  Skin:  Negative for rash.      Objective:    BP 125/80   Pulse (!) 109   Wt 252 lb (114.3 kg)   LMP 04/08/2022 (Exact Date)   BMI 39.47 kg/m  Physical Exam Exam conducted with a chaperone present.  Constitutional:      General: She is not in acute distress.    Appearance: She is well-developed.  HENT:     Head: Normocephalic and atraumatic.  Eyes:     General: No scleral icterus. Cardiovascular:     Rate and Rhythm: Normal rate.  Pulmonary:     Effort: Pulmonary effort is normal.  Abdominal:     Palpations: Abdomen is soft.  Musculoskeletal:     Cervical back: Neck supple.  Skin:    General: Skin is warm and dry.  Neurological:     Mental Status: She is alert and oriented to person, place, and time.    Procedure: Patient given informed consent, signed copy in the chart, time out was performed. Appropriate time out taken. . The patient was placed in the lithotomy position and the cervix brought into view with sterile speculum.  Portio of cervix cleansed x 2 with betadine swabs.  A tenaculum was placed in the anterior lip of the cervix.  The uterus was sounded for depth of 8. A pipelle was introduced to into the uterus, suction created,  and an endometrial sample was obtained. All equipment was removed and accounted for.  The patient tolerated the procedure well.       Assessment & Plan:   Problem List Items Addressed This Visit       Unprioritized   Dysplasia of cervix, low grade (CIN 1)    Given her on-going surveillance--discussed risk and decreased due to cervix removal, but will need on-going surveillance of the vagina due to this.      Abnormal uterine bleeding - Primary    EMB today. Schedule for u/s. Book for [facility]. Risks include but are not limited to bleeding, infection, injury to surrounding structures, including bowel, bladder, especially with prior c-section x 1, and ureters, blood clots, and death.  Likelihood of success is high.       Relevant Orders   US PELVIC COMPLETE WITH TRANSVAGINAL   Surgical pathology( [place]/ [facility])     Return in about 3 months (around 07/24/2022) for postop check.  [name], MD 04/24/2022 8:40 AM

## 2022-04-25 ENCOUNTER — Other Ambulatory Visit: Payer: Self-pay

## 2022-04-25 LAB — SURGICAL PATHOLOGY

## 2022-04-29 ENCOUNTER — Ambulatory Visit
Admission: RE | Admit: 2022-04-29 | Discharge: 2022-04-29 | Disposition: A | Discharge status: Home / Self Care | Payer: Commercial Managed Care - PPO | Source: Ambulatory Visit | Attending: Family Medicine | Admitting: Family Medicine

## 2022-04-29 DIAGNOSIS — N939 Abnormal uterine and vaginal bleeding, unspecified: Secondary | ICD-10-CM

## 2022-04-29 DIAGNOSIS — R188 Other ascites: Secondary | ICD-10-CM | POA: Diagnosis not present

## 2022-04-29 DIAGNOSIS — N888 Other specified noninflammatory disorders of cervix uteri: Secondary | ICD-10-CM | POA: Diagnosis not present

## 2022-04-30 ENCOUNTER — Encounter: Payer: Self-pay | Admitting: Family Medicine

## 2022-04-30 DIAGNOSIS — F411 Generalized anxiety disorder: Secondary | ICD-10-CM | POA: Diagnosis not present

## 2022-05-10 DIAGNOSIS — F411 Generalized anxiety disorder: Secondary | ICD-10-CM | POA: Diagnosis not present

## 2022-05-14 DIAGNOSIS — F411 Generalized anxiety disorder: Secondary | ICD-10-CM | POA: Diagnosis not present

## 2022-05-23 DIAGNOSIS — F411 Generalized anxiety disorder: Secondary | ICD-10-CM | POA: Diagnosis not present

## 2022-05-23 DIAGNOSIS — F4312 Post-traumatic stress disorder, chronic: Secondary | ICD-10-CM | POA: Diagnosis not present

## 2022-06-02 ENCOUNTER — Other Ambulatory Visit: Payer: Self-pay

## 2022-06-05 ENCOUNTER — Other Ambulatory Visit: Payer: Self-pay

## 2022-06-11 ENCOUNTER — Other Ambulatory Visit: Payer: Self-pay

## 2022-06-11 NOTE — H&P (Signed)
Kelly [name] is an 47 y.o. G37P2012 female.   Chief Complaint: abnormal vaginal bleeding HPI: patient desires hysterectomy due to on-going heavy menses and cervical dysplasia. H/o SVD x 1 and C-S with BTL x 1.  Past Medical History:  Diagnosis Date   Anxiety    Depression    GERD (gastroesophageal reflux disease)    Hyperlipemia    Myofascial pain syndrome     Past Surgical History:  Procedure Laterality Date   CESAREAN SECTION     CHOLECYSTECTOMY     COLPOSCOPY W/ BIOPSY / CURETTAGE  02/28/2022   TUBAL LIGATION      Family History  Problem Relation Age of Onset   Breast cancer Maternal Aunt    Social History:  reports that she has never smoked. She has never used smokeless tobacco. She reports that she does not drink alcohol and does not use drugs.  Allergies: No Known Allergies  No medications prior to admission.    A comprehensive review of systems was negative.  General appearance: alert, cooperative, appears stated age, and moderately obese Head: Normocephalic, without obvious abnormality, atraumatic Neck: supple, symmetrical, trachea midline Lungs:  normal effort Heart: regular rate and rhythm Abdomen: soft, non-tender; bowel sounds normal; no masses,  no organomegaly Extremities: extremities normal, atraumatic, no cyanosis or edema Skin: Skin color, texture, turgor normal. No rashes or lesions Neurologic: Grossly normal   No results found for this or any previous visit (from the past 24 hour(s)). No results found.  Assessment/Plan Active Problems:   Menorrhagia   Dysplasia of cervix, low grade (CIN 1)   Abnormal uterine bleeding  For TVH with bilateral salpingectomy if able. Risks include but are not limited to bleeding, infection, injury to surrounding structures, including bowel, bladder (especially with prior C-section) and ureters, blood clots, and death.  Likelihood of success is high.    Kelly [name] 06/11/2022, 12:15 AM

## 2022-06-11 NOTE — Pre-Procedure Instructions (Signed)
Surgical Instructions    Your procedure is scheduled on June 18, 2022.  Report to [facility] Main Entrance "A" at 6:45 A.M., then check in with the Admitting office.  Call this number if you have problems the morning of surgery:  [phone]  If you have any questions prior to your surgery date call [phone]: Open Monday-Friday 8am-4pm If you experience any cold or flu symptoms such as cough, fever, chills, shortness of breath, etc. between now and your scheduled surgery, please notify us at the above number.     Remember:  Do not eat after midnight the night before your surgery  You may drink clear liquids until 5:45 AM the morning of your surgery.   Clear liquids allowed are: Water, Non-Citrus Juices (without pulp), Carbonated Beverages, Clear Tea, Black Coffee Only (NO MILK, CREAM OR POWDERED CREAMER of any kind), and Gatorade.     Take these medicines the morning of surgery with A SIP OF WATER:  topiramate (TOPAMAX)    May take these medicines IF NEEDED:  clonazePAM (KLONOPIN)   Rimegepant Sulfate (NURTEC)    As of today, STOP taking any Aspirin (unless otherwise instructed by your surgeon) Aleve, Naproxen, Ibuprofen, Motrin, Advil, Goody's, BC's, all herbal medications, fish oil, and all vitamins.                     Do NOT Smoke (Tobacco/Vaping) for 24 hours prior to your procedure.  If you use a CPAP at night, you may bring your mask/headgear for your overnight stay.   Contacts, glasses, piercing's, hearing aid's, dentures or partials may not be worn into surgery, please bring cases for these belongings.    For patients admitted to the hospital, discharge time will be determined by your treatment team.   Patients discharged the day of surgery will not be allowed to drive home, and someone needs to stay with them for 24 hours.  SURGICAL WAITING ROOM VISITATION Patients having surgery or a procedure may have no more than 2 support people in the waiting area -  these visitors may rotate.   Children under the age of 52 must have an adult with them who is not the patient. If the patient needs to stay at the hospital during part of their recovery, the visitor guidelines for inpatient rooms apply. Pre-op nurse will coordinate an appropriate time for 1 support person to accompany patient in pre-op.  This support person may not rotate.   Please refer to the [facility] website for the visitor guidelines for Inpatients (after your surgery is over and you are in a regular room).    Special instructions:   [place]- Preparing For Surgery  Before surgery, you can play an important role. Because skin is not sterile, your skin needs to be as free of germs as possible. You can reduce the number of germs on your skin by washing with CHG (chlorahexidine gluconate) Soap before surgery.  CHG is an antiseptic cleaner which kills germs and bonds with the skin to continue killing germs even after washing.    Oral Hygiene is also important to reduce your risk of infection.  Remember - BRUSH YOUR TEETH THE MORNING OF SURGERY WITH YOUR REGULAR TOOTHPASTE  Please do not use if you have an allergy to CHG or antibacterial soaps. If your skin becomes reddened/irritated stop using the CHG.  Do not shave (including legs and underarms) for at least 48 hours prior to first CHG shower. It is OK to shave  your face.  Please follow these instructions carefully.   Shower the NIGHT BEFORE SURGERY and the MORNING OF SURGERY  If you chose to wash your hair, wash your hair first as usual with your normal shampoo.  After you shampoo, rinse your hair and body thoroughly to remove the shampoo.  Use CHG Soap as you would any other liquid soap. You can apply CHG directly to the skin and wash gently with a scrungie or a clean washcloth.   Apply the CHG Soap to your body ONLY FROM THE NECK DOWN.  Do not use on open wounds or open sores. Avoid contact with your eyes, ears, mouth and  genitals (private parts). Wash Face and genitals (private parts)  with your normal soap.   Wash thoroughly, paying special attention to the area where your surgery will be performed.  Thoroughly rinse your body with warm water from the neck down.  DO NOT shower/wash with your normal soap after using and rinsing off the CHG Soap.  Pat yourself dry with a CLEAN TOWEL.  Wear CLEAN PAJAMAS to bed the night before surgery  Place CLEAN SHEETS on your bed the night before your surgery  DO NOT SLEEP WITH PETS.   Day of Surgery: Take a shower with CHG soap. Do not wear jewelry or makeup Do not wear lotions, powders, perfumes/colognes, or deodorant. Do not shave 48 hours prior to surgery.  Men may shave face and neck. Do not bring valuables to the hospital.  [facility] is not responsible for any belongings or valuables. Do not wear nail polish, gel polish, artificial nails, or any other type of covering on natural nails (fingers and toes) If you have artificial nails or gel coating that need to be removed by a nail salon, please have this removed prior to surgery. Artificial nails or gel coating may interfere with anesthesia's ability to adequately monitor your vital signs.  Wear Clean/Comfortable clothing the morning of surgery Remember to brush your teeth WITH YOUR REGULAR TOOTHPASTE.   Please read over the following fact sheets that you were given.    If you received a COVID test during your pre-op visit  it is requested that you wear a mask when out in public, stay away from anyone that may not be feeling well and notify your surgeon if you develop symptoms. If you have been in contact with anyone that has tested positive in the last 10 days please notify you surgeon.

## 2022-06-12 ENCOUNTER — Encounter (HOSPITAL_COMMUNITY): Payer: Self-pay

## 2022-06-12 ENCOUNTER — Other Ambulatory Visit: Payer: Self-pay

## 2022-06-12 ENCOUNTER — Encounter (HOSPITAL_COMMUNITY)
Admission: RE | Admit: 2022-06-12 | Discharge: 2022-06-12 | Disposition: A | Discharge status: Home / Self Care | Payer: Commercial Managed Care - PPO | Source: Ambulatory Visit | Attending: Family Medicine | Admitting: Family Medicine

## 2022-06-12 VITALS — BP 113/84 | HR 101 | Temp 97.7°F | Resp 18 | Ht 67.0 in | Wt 253.0 lb

## 2022-06-12 DIAGNOSIS — K449 Diaphragmatic hernia without obstruction or gangrene: Secondary | ICD-10-CM | POA: Diagnosis not present

## 2022-06-12 DIAGNOSIS — N939 Abnormal uterine and vaginal bleeding, unspecified: Secondary | ICD-10-CM | POA: Diagnosis not present

## 2022-06-12 DIAGNOSIS — G4733 Obstructive sleep apnea (adult) (pediatric): Secondary | ICD-10-CM | POA: Diagnosis not present

## 2022-06-12 DIAGNOSIS — I2510 Atherosclerotic heart disease of native coronary artery without angina pectoris: Secondary | ICD-10-CM

## 2022-06-12 DIAGNOSIS — Z01812 Encounter for preprocedural laboratory examination: Secondary | ICD-10-CM | POA: Diagnosis not present

## 2022-06-12 HISTORY — DX: Headache, unspecified: R51.9

## 2022-06-12 LAB — BASIC METABOLIC PANEL
Anion gap: 7 (ref 5–15)
BUN: 14 mg/dL (ref 6–20)
CO2: 21 mmol/L — ABNORMAL LOW (ref 22–32)
Calcium: 9.2 mg/dL (ref 8.9–10.3)
Chloride: 110 mmol/L (ref 98–111)
Creatinine, Ser: 1.16 mg/dL — ABNORMAL HIGH (ref 0.44–1.00)
GFR, Estimated: 60 mL/min — ABNORMAL LOW (ref >60)
Glucose, Bld: 100 mg/dL — ABNORMAL HIGH (ref 70–99)
Potassium: 3.8 mmol/L (ref 3.5–5.1)
Sodium: 138 mmol/L (ref 135–145)

## 2022-06-12 LAB — CBC
HCT: 38.6 % (ref 36.0–46.0)
Hemoglobin: 12.7 g/dL (ref 12.0–15.0)
MCH: 29.6 pg (ref 26.0–34.0)
MCHC: 32.9 g/dL (ref 30.0–36.0)
MCV: 90 fL (ref 80.0–100.0)
Platelets: 250 10*3/uL (ref 150–400)
RBC: 4.29 MIL/uL (ref 3.87–5.11)
RDW: 12.6 % (ref 11.5–15.5)
WBC: 6.9 10*3/uL (ref 4.0–10.5)
nRBC: 0 % (ref 0.0–0.2)

## 2022-06-12 NOTE — Progress Notes (Signed)
PCP - Allie Dimmer, PA-C Cardiologist - Saw Dr. Tor Netters in 2022 for work-up r/t dizziness  PPM/ICD - Denies Device Orders - n/a Rep Notified - n/a  Chest x-ray - n/a EKG - 03/09/2021 Stress Test - 02/07/2021 ECHO - 03/09/2021 Cardiac Cath - Denies  Sleep Study - +OSA in 2022, but never picked up CPAP  No DM  Last dose of GLP1 agonist- n/a GLP1 instructions: n/a  Blood Thinner Instructions: n/a Aspirin Instructions: n/a  ERAS Protcol - Clear liquids until 0545 morning of surgery PRE-SURGERY Ensure or G2- none ordered  COVID TEST- n/a   Anesthesia review: Yes. Cardiac Work-Up in 2022 for dizziness. Discussed with Karoline Caldwell, PA-C   Patient denies shortness of breath, fever, cough and chest pain at PAT appointment   All instructions explained to the patient, with a verbal understanding of the material. Patient agrees to go over the instructions while at home for a better understanding. Patient also instructed to self quarantine after being tested for COVID-19. The opportunity to ask questions was provided.

## 2022-06-13 NOTE — Progress Notes (Signed)
Anesthesia Chart Review:  Patient had cardiology evaluation in 2022 for episode of dizziness/presyncope and prolonged QT.  Workup was benign, echo and stress test were normal, prolonged QT felt possibly related to Abilify which was discontinued.  No further episodes of dizziness.  Last seen by cardiologist Dr. Dot Lanes at [facility] on 04/17/2021.  Per note, "- ECG today shows sinus rhythm with normal QT - prior evaluation with echo and nuclear stress test reviewed - ziopatch monitor placed after [facility] ER visit showed no concerning arrhythmia. She had rare ectopy and triggered events correlated with sinus rhythm and ectopy. - ECG today shows sinus rhythm with single PVC and normal QT - CXR at [facility] showed solitary lung nodule "Questionable small irregular 7-8 mm right upper lung pulmonary nodule" Chest CT non-contrast to evaluate. Follow-up: No follow-ups on file."  History of OSA, does not use CPAP.  CT chest 04/26/2021 showed no suspicious pulmonary nodules, small hiatal hernia.  Preop labs reviewed, unremarkable.  EKG 03/09/2021: Sinus rhythm.  Rate 72.  Low voltage, precordial leads.  Borderline T abnormalities, anterior leads.  CT chest 04/26/2021 (Care Everywhere): -No suspicious pulmonary nodules. There are multiple calcified granulomas, which are benign and do not require further follow-up.   -Calcified atherosclerotic sclerotic disease is evident within the coronary arterial distribution   -Small hiatal hernia   TTE 03/09/2021:  1. Left ventricular ejection fraction, by estimation, is 60 to 65%. The  left ventricle has normal function. The left ventricle has no regional  wall motion abnormalities. Left ventricular diastolic parameters were  normal.   2. Right ventricular systolic function is normal. The right ventricular  size is not well visualized.   3. The mitral valve is normal in structure. No evidence of mitral valve  regurgitation. No evidence of mitral stenosis.   4. The aortic  valve is normal in structure. Aortic valve regurgitation is  not visualized. No aortic stenosis is present.   Nuclear stress 02/07/2021 (Care Everywhere): Impressions:  - No significant ischemia is noted on perfusion imaging   - There is a small in size, subtle in severity, reverse defect involving  the mid anteroseptal segment.  This is consistent with probable artifact  and less likely due to ischemia.   - There is a small in size, moderate in severity, fixed defect involving  the apical lateral segment.  This is consistent with probable artifact.   - The patient exercised under supervision for a total of 3 min(s), 0  sec(s) achieving stage 2 (7.0 METS) of the Bruce protocol.   - Post stress:  Global systolic function is normal.  The ejection fraction  was greater than 65%.   - Sensitivity and specificity of this test are reduced by the noted  attenuation, BMI >40.   - In the future, for larger patients or those with attenuation issues,  consider ordering PET rubidium study.     Wynonia Musty [facility]/Anesthesiology Phone [phone] 06/13/2022 12:16 PM

## 2022-06-13 NOTE — Anesthesia Preprocedure Evaluation (Addendum)
Anesthesia Evaluation  Patient identified by MRN, date of birth, ID band Patient awake    Reviewed: Allergy & Precautions, NPO status , Patient's Chart, lab work & pertinent test results  Airway Mallampati: II  TM Distance: <3 FB Neck ROM: Full    Dental  (+) Teeth Intact, Dental Advisory Given   Pulmonary sleep apnea    Pulmonary exam normal breath sounds clear to auscultation       Cardiovascular negative cardio ROS Normal cardiovascular exam Rhythm:Regular Rate:Normal     Neuro/Psych  Headaches PSYCHIATRIC DISORDERS Anxiety Depression     Neuromuscular disease    GI/Hepatic Neg liver ROS,GERD  ,,  Endo/Other    Morbid obesity  Renal/GU negative Renal ROS     Musculoskeletal negative musculoskeletal ROS (+)    Abdominal   Peds  Hematology negative hematology ROS (+)   Anesthesia Other Findings Day of surgery medications reviewed with the patient.  Reproductive/Obstetrics AUB                             Anesthesia Physical Anesthesia Plan  ASA: 3  Anesthesia Plan: General   Post-op Pain Management: Tylenol PO (pre-op)*   Induction: Intravenous  PONV Risk Score and Plan: 4 or greater and Midazolam, Dexamethasone, Ondansetron and Scopolamine patch - Pre-op  Airway Management Planned: Oral ETT  Additional Equipment:   Intra-op Plan:   Post-operative Plan: Extubation in OR  Informed Consent: I have reviewed the patients History and Physical, chart, labs and discussed the procedure including the risks, benefits and alternatives for the proposed anesthesia with the patient or authorized representative who has indicated his/her understanding and acceptance.     Dental advisory given  Plan Discussed with: CRNA  Anesthesia Plan Comments: (2nd PIV after induction  PAT note by Karoline Caldwell, PA-C: Patient had cardiology evaluation in 2022 for episode of dizziness/presyncope and  prolonged QT.  Workup was benign, echo and stress test were normal, prolonged QT felt possibly related to Abilify which was discontinued.  No further episodes of dizziness.  Last seen by cardiologist Dr. Dot Lanes at [facility] on 04/17/2021.  Per note, "- ECG today shows sinus rhythm with normal QT - prior evaluation with echo and nuclear stress test reviewed - ziopatch monitor placed after [facility] ER visit showed no concerning arrhythmia. She had rare ectopy and triggered events correlated with sinus rhythm and ectopy. - ECG today shows sinus rhythm with single PVC and normal QT - CXR at [facility] showed solitary lung nodule "Questionable small irregular 7-8 mm right upper lung pulmonary nodule" Chest CT non-contrast to evaluate. Follow-up: No follow-ups on file."  History of OSA, does not use CPAP.  CT chest 04/26/2021 showed no suspicious pulmonary nodules, small hiatal hernia.  Preop labs reviewed, unremarkable.  EKG 03/09/2021: Sinus rhythm.  Rate 72.  Low voltage, precordial leads.  Borderline T abnormalities, anterior leads.  CT chest 04/26/2021 (Care Everywhere): -No suspicious pulmonary nodules. There are multiple calcified granulomas, which are benign and do not require further follow-up.   -Calcified atherosclerotic sclerotic disease is evident within the coronary arterial distribution   -Small hiatal hernia   TTE 03/09/2021: 1. Left ventricular ejection fraction, by estimation, is 60 to 65%. The  left ventricle has normal function. The left ventricle has no regional  wall motion abnormalities. Left ventricular diastolic parameters were  normal.  2. Right ventricular systolic function is normal. The right ventricular  size is not well visualized.  3.  The mitral valve is normal in structure. No evidence of mitral valve  regurgitation. No evidence of mitral stenosis.  4. The aortic valve is normal in structure. Aortic valve regurgitation is  not visualized. No aortic stenosis is present.    Nuclear stress 02/07/2021 (Care Everywhere): Impressions:  - No significant ischemia is noted on perfusion imaging   - There is a small in size, subtle in severity, reverse defect involving  the mid anteroseptal segment. This is consistent with probable artifact  and less likely due to ischemia.   - There is a small in size, moderate in severity, fixed defect involving  the apical lateral segment. This is consistent with probable artifact.   - The patient exercised under supervision for a total of 3 min(s), 0  sec(s) achieving stage 2 (7.0 METS) of the Bruce protocol.   - Post stress: Global systolic function is normal. The ejection fraction  was greater than 65%.   - Sensitivity and specificity of this test are reduced by the noted  attenuation, BMI >40.   - In the future, for larger patients or those with attenuation issues,  consider ordering PET rubidium study.    )        Anesthesia Quick Evaluation

## 2022-06-16 DIAGNOSIS — F411 Generalized anxiety disorder: Secondary | ICD-10-CM | POA: Diagnosis not present

## 2022-06-18 ENCOUNTER — Encounter (HOSPITAL_COMMUNITY)
Admission: EM | Disposition: A | Discharge status: Home / Self Care | Payer: Self-pay | Source: Ambulatory Visit | Attending: Family Medicine

## 2022-06-18 ENCOUNTER — Other Ambulatory Visit: Payer: Self-pay

## 2022-06-18 ENCOUNTER — Encounter (HOSPITAL_COMMUNITY): Payer: Self-pay | Admitting: Family Medicine

## 2022-06-18 ENCOUNTER — Ambulatory Visit (HOSPITAL_COMMUNITY): Payer: Commercial Managed Care - PPO | Admitting: Physician Assistant

## 2022-06-18 ENCOUNTER — Ambulatory Visit (HOSPITAL_BASED_OUTPATIENT_CLINIC_OR_DEPARTMENT_OTHER): Payer: Commercial Managed Care - PPO | Admitting: Anesthesiology

## 2022-06-18 ENCOUNTER — Ambulatory Visit (HOSPITAL_COMMUNITY)
Admission: EM | Admit: 2022-06-18 | Discharge: 2022-06-19 | Disposition: A | Discharge status: Home / Self Care | Payer: Commercial Managed Care - PPO | Source: Ambulatory Visit | Attending: Family Medicine | Admitting: Family Medicine

## 2022-06-18 DIAGNOSIS — N938 Other specified abnormal uterine and vaginal bleeding: Secondary | ICD-10-CM

## 2022-06-18 DIAGNOSIS — F418 Other specified anxiety disorders: Secondary | ICD-10-CM | POA: Insufficient documentation

## 2022-06-18 DIAGNOSIS — N870 Mild cervical dysplasia: Secondary | ICD-10-CM

## 2022-06-18 DIAGNOSIS — Z6839 Body mass index (BMI) 39.0-39.9, adult: Secondary | ICD-10-CM | POA: Diagnosis not present

## 2022-06-18 DIAGNOSIS — G4730 Sleep apnea, unspecified: Secondary | ICD-10-CM | POA: Insufficient documentation

## 2022-06-18 DIAGNOSIS — R519 Headache, unspecified: Secondary | ICD-10-CM | POA: Insufficient documentation

## 2022-06-18 DIAGNOSIS — Z90710 Acquired absence of both cervix and uterus: Secondary | ICD-10-CM

## 2022-06-18 DIAGNOSIS — N920 Excessive and frequent menstruation with regular cycle: Secondary | ICD-10-CM | POA: Diagnosis present

## 2022-06-18 DIAGNOSIS — N939 Abnormal uterine and vaginal bleeding, unspecified: Secondary | ICD-10-CM | POA: Diagnosis not present

## 2022-06-18 DIAGNOSIS — N888 Other specified noninflammatory disorders of cervix uteri: Secondary | ICD-10-CM | POA: Insufficient documentation

## 2022-06-18 HISTORY — PX: VAGINAL HYSTERECTOMY: SHX2639

## 2022-06-18 LAB — CBC
HCT: 36.6 % (ref 36.0–46.0)
Hemoglobin: 12 g/dL (ref 12.0–15.0)
MCH: 29.1 pg (ref 26.0–34.0)
MCHC: 32.8 g/dL (ref 30.0–36.0)
MCV: 88.8 fL (ref 80.0–100.0)
Platelets: 228 10*3/uL (ref 150–400)
RBC: 4.12 MIL/uL (ref 3.87–5.11)
RDW: 12.8 % (ref 11.5–15.5)
WBC: 9.6 10*3/uL (ref 4.0–10.5)
nRBC: 0 % (ref 0.0–0.2)

## 2022-06-18 LAB — TYPE AND SCREEN
ABO/RH(D): O POS
Antibody Screen: NEGATIVE

## 2022-06-18 LAB — CREATININE, SERUM
Creatinine, Ser: 0.98 mg/dL (ref 0.44–1.00)
GFR, Estimated: >60 mL/min (ref >60)

## 2022-06-18 LAB — POCT PREGNANCY, URINE: Preg Test, Ur: NEGATIVE

## 2022-06-18 LAB — ABO/RH: ABO/RH(D): O POS

## 2022-06-18 SURGERY — HYSTERECTOMY, VAGINAL
Anesthesia: General | Site: Uterus | Laterality: Bilateral

## 2022-06-18 MED ORDER — MIDAZOLAM HCL 2 MG/2ML IJ SOLN
INTRAMUSCULAR | Status: AC
  Filled 2022-06-18: qty 2

## 2022-06-18 MED ORDER — DOCUSATE SODIUM 100 MG PO CAPS
100.0000 mg | ORAL_CAPSULE | Freq: Two times a day (BID) | ORAL | Status: DC
  Administered 2022-06-18 – 2022-06-19 (×3): 100 mg via ORAL
  Filled 2022-06-18 (×3): qty 1

## 2022-06-18 MED ORDER — OXYCODONE HCL 5 MG PO TABS
5.0000 mg | ORAL_TABLET | ORAL | Status: DC | PRN
  Administered 2022-06-18 (×2): 10 mg via ORAL
  Administered 2022-06-19: 5 mg via ORAL
  Filled 2022-06-18: qty 2
  Filled 2022-06-18: qty 1
  Filled 2022-06-18: qty 2

## 2022-06-18 MED ORDER — ONDANSETRON HCL 4 MG PO TABS: 4.0000 mg | ORAL_TABLET | Freq: Four times a day (QID) | ORAL | Status: DC | PRN

## 2022-06-18 MED ORDER — FENTANYL CITRATE (PF) 250 MCG/5ML IJ SOLN
INTRAMUSCULAR | Status: DC | PRN
  Administered 2022-06-18: 100 ug via INTRAVENOUS
  Administered 2022-06-18 (×3): 50 ug via INTRAVENOUS

## 2022-06-18 MED ORDER — GABAPENTIN 100 MG PO CAPS: 100.0000 mg | ORAL_CAPSULE | Freq: Three times a day (TID) | ORAL | Status: DC

## 2022-06-18 MED ORDER — LACTATED RINGERS IV SOLN: INTRAVENOUS | Status: DC

## 2022-06-18 MED ORDER — PHENYLEPHRINE 80 MCG/ML (10ML) SYRINGE FOR IV PUSH (FOR BLOOD PRESSURE SUPPORT)
PREFILLED_SYRINGE | INTRAVENOUS | Status: AC
  Filled 2022-06-18: qty 10

## 2022-06-18 MED ORDER — EPHEDRINE SULFATE-NACL 50-0.9 MG/10ML-% IV SOSY
PREFILLED_SYRINGE | INTRAVENOUS | Status: DC | PRN
  Administered 2022-06-18: 5 mg via INTRAVENOUS

## 2022-06-18 MED ORDER — TOPIRAMATE 100 MG PO TABS
200.0000 mg | ORAL_TABLET | Freq: Every day | ORAL | Status: DC
  Administered 2022-06-18: 200 mg via ORAL
  Filled 2022-06-18 (×2): qty 2

## 2022-06-18 MED ORDER — LURASIDONE HCL 40 MG PO TABS
60.0000 mg | ORAL_TABLET | Freq: Every day | ORAL | Status: DC
  Administered 2022-06-18: 60 mg via ORAL
  Filled 2022-06-18: qty 1

## 2022-06-18 MED ORDER — FENTANYL CITRATE (PF) 100 MCG/2ML IJ SOLN
25.0000 ug | INTRAMUSCULAR | Status: DC | PRN
  Administered 2022-06-18 (×3): 50 ug via INTRAVENOUS

## 2022-06-18 MED ORDER — POVIDONE-IODINE 10 % EX SWAB
2.0000 | Freq: Once | CUTANEOUS | Status: AC
  Administered 2022-06-18: 2 via TOPICAL

## 2022-06-18 MED ORDER — DEXAMETHASONE SODIUM PHOSPHATE 10 MG/ML IJ SOLN
INTRAMUSCULAR | Status: DC | PRN
  Administered 2022-06-18: 10 mg via INTRAVENOUS

## 2022-06-18 MED ORDER — LIDOCAINE 2% (20 MG/ML) 5 ML SYRINGE
INTRAMUSCULAR | Status: AC
  Filled 2022-06-18: qty 5

## 2022-06-18 MED ORDER — LIDOCAINE-EPINEPHRINE 1 %-1:100000 IJ SOLN
INTRAMUSCULAR | Status: AC
  Filled 2022-06-18: qty 1

## 2022-06-18 MED ORDER — SCOPOLAMINE 1 MG/3DAYS TD PT72
1.0000 | MEDICATED_PATCH | Freq: Once | TRANSDERMAL | Status: DC
  Filled 2022-06-18: qty 1

## 2022-06-18 MED ORDER — ENOXAPARIN SODIUM 60 MG/0.6ML IJ SOSY
50.0000 mg | PREFILLED_SYRINGE | INTRAMUSCULAR | Status: DC
  Administered 2022-06-19: 50 mg via SUBCUTANEOUS
  Filled 2022-06-18: qty 0.6

## 2022-06-18 MED ORDER — BISACODYL 10 MG RE SUPP: 10.0000 mg | Freq: Every day | RECTAL | Status: DC | PRN

## 2022-06-18 MED ORDER — DEXTROSE IN LACTATED RINGERS 5 % IV SOLN: INTRAVENOUS | Status: DC

## 2022-06-18 MED ORDER — HYDROMORPHONE HCL 1 MG/ML IJ SOLN
0.2000 mg | INTRAMUSCULAR | Status: DC | PRN
  Administered 2022-06-18 (×2): 0.4 mg via INTRAVENOUS
  Filled 2022-06-18 (×2): qty 1

## 2022-06-18 MED ORDER — LIDOCAINE 2% (20 MG/ML) 5 ML SYRINGE
INTRAMUSCULAR | Status: DC | PRN
  Administered 2022-06-18: 80 mg via INTRAVENOUS

## 2022-06-18 MED ORDER — ONDANSETRON HCL 4 MG/2ML IJ SOLN: 4.0000 mg | Freq: Four times a day (QID) | INTRAMUSCULAR | Status: DC | PRN

## 2022-06-18 MED ORDER — FENTANYL CITRATE (PF) 100 MCG/2ML IJ SOLN
INTRAMUSCULAR | Status: AC
  Filled 2022-06-18: qty 2

## 2022-06-18 MED ORDER — GABAPENTIN 300 MG PO CAPS
300.0000 mg | ORAL_CAPSULE | Freq: Every day | ORAL | Status: DC
  Administered 2022-06-18: 300 mg via ORAL
  Filled 2022-06-18: qty 1

## 2022-06-18 MED ORDER — ORAL CARE MOUTH RINSE: 15.0000 mL | Freq: Once | OROMUCOSAL | Status: AC

## 2022-06-18 MED ORDER — ROCURONIUM BROMIDE 10 MG/ML (PF) SYRINGE
PREFILLED_SYRINGE | INTRAVENOUS | Status: DC | PRN
  Administered 2022-06-18: 70 mg via INTRAVENOUS

## 2022-06-18 MED ORDER — FENTANYL CITRATE (PF) 250 MCG/5ML IJ SOLN
INTRAMUSCULAR | Status: AC
  Filled 2022-06-18: qty 5

## 2022-06-18 MED ORDER — ONDANSETRON HCL 4 MG/2ML IJ SOLN
INTRAMUSCULAR | Status: DC | PRN
  Administered 2022-06-18: 4 mg via INTRAVENOUS

## 2022-06-18 MED ORDER — OXYCODONE-ACETAMINOPHEN 5-325 MG PO TABS
1.0000 | ORAL_TABLET | Freq: Four times a day (QID) | ORAL | 0 refills | Status: DC | PRN
  Filled 2022-06-18: qty 15, 2d supply, fill #0

## 2022-06-18 MED ORDER — SODIUM CHLORIDE 0.9 % IV SOLN: INTRAVENOUS | Status: DC | PRN

## 2022-06-18 MED ORDER — PROMETHAZINE HCL 25 MG/ML IJ SOLN: 6.2500 mg | INTRAMUSCULAR | Status: DC | PRN

## 2022-06-18 MED ORDER — PROPOFOL 10 MG/ML IV BOLUS
INTRAVENOUS | Status: DC | PRN
  Administered 2022-06-18: 170 mg via INTRAVENOUS

## 2022-06-18 MED ORDER — SUGAMMADEX SODIUM 200 MG/2ML IV SOLN
INTRAVENOUS | Status: DC | PRN
  Administered 2022-06-18: 100 mg via INTRAVENOUS
  Administered 2022-06-18: 200 mg via INTRAVENOUS

## 2022-06-18 MED ORDER — POLYETHYLENE GLYCOL 3350 17 G PO PACK: 17.0000 g | PACK | Freq: Every day | ORAL | Status: DC | PRN

## 2022-06-18 MED ORDER — LIDOCAINE-EPINEPHRINE 1 %-1:100000 IJ SOLN
INTRAMUSCULAR | Status: DC | PRN
  Administered 2022-06-18: 20 mL

## 2022-06-18 MED ORDER — SIMETHICONE 80 MG PO CHEW: 80.0000 mg | CHEWABLE_TABLET | Freq: Four times a day (QID) | ORAL | Status: DC | PRN

## 2022-06-18 MED ORDER — MIDAZOLAM HCL 2 MG/2ML IJ SOLN
INTRAMUSCULAR | Status: DC | PRN
  Administered 2022-06-18: 2 mg via INTRAVENOUS

## 2022-06-18 MED ORDER — 0.9 % SODIUM CHLORIDE (POUR BTL) OPTIME
TOPICAL | Status: DC | PRN
  Administered 2022-06-18: 1000 mL

## 2022-06-18 MED ORDER — DULOXETINE HCL 20 MG PO CPEP
120.0000 mg | ORAL_CAPSULE | Freq: Every day | ORAL | Status: DC
  Administered 2022-06-18: 120 mg via ORAL
  Filled 2022-06-18: qty 6

## 2022-06-18 MED ORDER — ACETAMINOPHEN 500 MG PO TABS
1000.0000 mg | ORAL_TABLET | Freq: Four times a day (QID) | ORAL | Status: DC
  Administered 2022-06-18 – 2022-06-19 (×4): 1000 mg via ORAL
  Filled 2022-06-18 (×5): qty 2

## 2022-06-18 MED ORDER — IBUPROFEN 600 MG PO TABS: 600.0000 mg | ORAL_TABLET | Freq: Four times a day (QID) | ORAL | Status: DC

## 2022-06-18 MED ORDER — KETAMINE HCL 10 MG/ML IJ SOLN
INTRAMUSCULAR | Status: DC | PRN
  Administered 2022-06-18: 20 mg via INTRAVENOUS

## 2022-06-18 MED ORDER — KETOROLAC TROMETHAMINE 30 MG/ML IJ SOLN
30.0000 mg | Freq: Four times a day (QID) | INTRAMUSCULAR | Status: AC
  Administered 2022-06-18 – 2022-06-19 (×4): 30 mg via INTRAVENOUS
  Filled 2022-06-18 (×4): qty 1

## 2022-06-18 MED ORDER — ROCURONIUM BROMIDE 10 MG/ML (PF) SYRINGE
PREFILLED_SYRINGE | INTRAVENOUS | Status: AC
  Filled 2022-06-18: qty 10

## 2022-06-18 MED ORDER — MENTHOL 3 MG MT LOZG: 1.0000 | LOZENGE | OROMUCOSAL | Status: DC | PRN

## 2022-06-18 MED ORDER — ONDANSETRON HCL 4 MG/2ML IJ SOLN
INTRAMUSCULAR | Status: AC
  Filled 2022-06-18: qty 2

## 2022-06-18 MED ORDER — KETAMINE HCL 50 MG/5ML IJ SOSY
PREFILLED_SYRINGE | INTRAMUSCULAR | Status: AC
  Filled 2022-06-18: qty 5

## 2022-06-18 MED ORDER — EPHEDRINE 5 MG/ML INJ
INTRAVENOUS | Status: AC
  Filled 2022-06-18: qty 5

## 2022-06-18 MED ORDER — PROPOFOL 10 MG/ML IV BOLUS
INTRAVENOUS | Status: AC
  Filled 2022-06-18: qty 20

## 2022-06-18 MED ORDER — DEXAMETHASONE SODIUM PHOSPHATE 10 MG/ML IJ SOLN
INTRAMUSCULAR | Status: AC
  Filled 2022-06-18: qty 1

## 2022-06-18 MED ORDER — CHLORHEXIDINE GLUCONATE 0.12 % MT SOLN
15.0000 mL | Freq: Once | OROMUCOSAL | Status: AC
  Administered 2022-06-18: 15 mL via OROMUCOSAL
  Filled 2022-06-18: qty 15

## 2022-06-18 MED ORDER — SOD CITRATE-CITRIC ACID 500-334 MG/5ML PO SOLN
30.0000 mL | ORAL | Status: AC
  Administered 2022-06-18: 30 mL via ORAL
  Filled 2022-06-18: qty 30

## 2022-06-18 MED ORDER — PROPOFOL 500 MG/50ML IV EMUL
INTRAVENOUS | Status: DC | PRN
  Administered 2022-06-18: 25 ug/kg/min via INTRAVENOUS

## 2022-06-18 MED ORDER — ESTRADIOL 0.1 MG/GM VA CREA
TOPICAL_CREAM | VAGINAL | Status: AC
  Filled 2022-06-18: qty 42.5

## 2022-06-18 MED ORDER — CEFAZOLIN SODIUM-DEXTROSE 2-4 GM/100ML-% IV SOLN
2.0000 g | INTRAVENOUS | Status: AC
  Administered 2022-06-18: 2 g via INTRAVENOUS
  Filled 2022-06-18: qty 100

## 2022-06-18 MED ORDER — ACETAMINOPHEN 500 MG PO TABS
1000.0000 mg | ORAL_TABLET | ORAL | Status: AC
  Administered 2022-06-18: 1000 mg via ORAL
  Filled 2022-06-18: qty 2

## 2022-06-18 SURGICAL SUPPLY — 25 items
CANISTER SUCT 3000ML PPV (MISCELLANEOUS) ×1 IMPLANT
GAUZE 4X4 16PLY ~~LOC~~+RFID DBL (SPONGE) ×1 IMPLANT
GLOVE BIOGEL PI IND STRL 6.5 (GLOVE) ×1 IMPLANT
GLOVE ECLIPSE 7.0 STRL STRAW (GLOVE) ×2 IMPLANT
GLOVE SURG UNDER POLY LF SZ7 (GLOVE) ×2 IMPLANT
GOWN STRL REUS W/ TWL LRG LVL3 (GOWN DISPOSABLE) ×5 IMPLANT
GOWN STRL REUS W/TWL LRG LVL3 (GOWN DISPOSABLE) ×4
KIT TURNOVER KIT B (KITS) ×1 IMPLANT
MANIFOLD NEPTUNE II (INSTRUMENTS) IMPLANT
NDL SPNL 18GX3.5 QUINCKE PK (NEEDLE) ×1 IMPLANT
NEEDLE SPNL 18GX3.5 QUINCKE PK (NEEDLE) ×1 IMPLANT
NS IRRIG 1000ML POUR BTL (IV SOLUTION) ×1 IMPLANT
PACK VAGINAL WOMENS (CUSTOM PROCEDURE TRAY) ×1 IMPLANT
PAD OB MATERNITY 4.3X12.25 (PERSONAL CARE ITEMS) ×1 IMPLANT
SPIKE FLUID TRANSFER (MISCELLANEOUS) IMPLANT
SUT VIC AB 0 CT1 18XCR BRD8 (SUTURE) ×3 IMPLANT
SUT VIC AB 0 CT1 27 (SUTURE) ×2
SUT VIC AB 0 CT1 27XBRD ANBCTR (SUTURE) ×2 IMPLANT
SUT VIC AB 0 CT1 8-18 (SUTURE) ×2
SUT VICRYL 0 TIES 12 18 (SUTURE) ×1 IMPLANT
SUT VICRYL 4-0 PS2 18IN ABS (SUTURE) IMPLANT
SYR 20ML LL LF (SYRINGE) ×1 IMPLANT
TOWEL GREEN STERILE FF (TOWEL DISPOSABLE) ×2 IMPLANT
TRAY FOLEY W/BAG SLVR 14FR (SET/KITS/TRAYS/PACK) ×1 IMPLANT
UNDERPAD 30X36 HEAVY ABSORB (UNDERPADS AND DIAPERS) ×1 IMPLANT

## 2022-06-18 NOTE — Op Note (Signed)
Preoperative diagnosis: abnormal uterine bleeding, CIN1  Postoperative diagnosis: Same  Procedure: Transvaginal hysterectomy with bilateral salpingectomy  Surgeon: Standley Dakins. Kennon Rounds, M.D.  Assistant: Aletha Halim, MD An experienced assistant was required given the standard of surgical care given the complexity of the case.  This assistant was needed for exposure, dissection, suctioning, retraction, instrument exchange, and for overall help during the procedure.  Anesthesia: General ETT-,Turk, Harriette Ohara, MD, MD  Findings: Normal appearing uterus, tubes and ovaries.  Estimated blood loss: 100 cc  Specimen: Uterus + tubes to pathology  Reason for procedure: Patient had abnormal bleeding and CIN1.  She desired definitive treament.  Risks of  hysterectomy reviewed.  Risks include but are not limited to bleeding, infection, injury to surrounding structures, including bowel, bladder and ureters, blood clots, and death.  Likelihood of success of surgery is high. She is aware of the possible need for on-going vaginal surveillance.   Procedure: Patient was taken to the OR where she was placed in dorsal lithotomy in [place]. She was prepped and draped in the usual sterile fashion. A timeout was performed. The patient received 1 g of Ancef prior to procedure. The patient had SCDs in place.  A speculum was placed inside the vagina. The cervix was visualized and grasped with 2 doublle-tooth tenacula. 20 cc of 1% lidocaine with epinephrine were injected paracervically. A knife was used to make a circumferential incision around the vagina. An opened sponge was used to dissect the vagina off the cervix. The posterior peritoneum was entered sharply with Mayo scissors. The posterior peritoneum was tagged to the vaginal cuff with a single stitch. The anterior peritoneal cavity was entered sharply with careful dissection of the bladder off the underlying cervix. A Heaney clamp was used to clamp first the left  uterosacral ligament and cardinal which was then cut and Haney suture ligated with 0 Vicryl stitch, the stitch was held. Similarly the right uterosacral ligament was clamped cut and suture ligated.  Sequential bites up the broad to the uterine arteries were taken until the tubo-ovarian pedicles were encountered. The left utero-ovarian pedicle grasped with a Heaney clamp x 2. A free tie and suture ligature used for hemostasis. A similar procedure was done on the right with uterine removal. The right tube was easily visualized, grasped with a Babcock and a Kelly clamp. The tube removed and free tie done for hemostasis. The left tube was similarly removed. Inspection of all pedicles revealed adequate hemostasis. There was some bleeding noted at the vaginal cuff on the right side. A figure of eight done here. The vagina was closed with 0 Vicryl suture in a locked running fashion with care taken to incorporate the uterosacral pedicles and posterior peritoneum. Excellent hemostasis was noted at the end of the case. The vaginal cuff was inspected there was minimal bleeding noted.  A Foley catheter is placed inside her bladder. Clear, yellow urine was noted. All instrument needle and lap counts were correct x 2. Patient was awakened taken to recovery room in stable condition.  [name], MD 06/18/2022, 9:57 AM

## 2022-06-18 NOTE — Interval H&P Note (Signed)
History and Physical Interval Note:  06/18/2022 7:39 AM  Kelly [name]  has presented today for surgery, with the diagnosis of AUB.  The various methods of treatment have been discussed with the patient and family. After consideration of risks, benefits and other options for treatment, the patient has consented to  Procedure(s): HYSTERECTOMY VAGINAL WITH SALPINGECTOMY (Bilateral) as a surgical intervention.  The patient's history has been reviewed, patient examined, no change in status, stable for surgery.  I have reviewed the patient's chart and labs.  Questions were answered to the patient's satisfaction.     [name]

## 2022-06-18 NOTE — Anesthesia Postprocedure Evaluation (Signed)
Anesthesia Post Note  Patient: Kelly [name]  Procedure(s) Performed: HYSTERECTOMY VAGINAL WITH BILATERAL SALPINGECTOMY (Bilateral: Uterus)     Patient location during evaluation: PACU Anesthesia Type: General Level of consciousness: awake and alert Pain management: pain level controlled Vital Signs Assessment: post-procedure vital signs reviewed and stable Respiratory status: spontaneous breathing, nonlabored ventilation, respiratory function stable and patient connected to nasal cannula oxygen Cardiovascular status: blood pressure returned to baseline and stable Postop Assessment: no apparent nausea or vomiting Anesthetic complications: no   There were no known notable events for this encounter.  Last Vitals:  Vitals:   06/18/22 1238 06/18/22 1240  BP: 131/77   Pulse: (!) 52   Resp: 18   Temp: (!) 36.2 C   SpO2: 100% 100%    Last Pain:  Vitals:   06/18/22 1238  TempSrc: Axillary  PainSc:                  Santa Lighter

## 2022-06-18 NOTE — Anesthesia Procedure Notes (Signed)
Procedure Name: Intubation Date/Time: 06/18/2022 8:52 AM  Performed by: Ester Rink, CRNAPre-anesthesia Checklist: Patient identified, Emergency Drugs available, Suction available and Patient being monitored Patient Re-evaluated:Patient Re-evaluated prior to induction Oxygen Delivery Method: Circle system utilized Preoxygenation: Pre-oxygenation with 100% oxygen Induction Type: IV induction Ventilation: Mask ventilation without difficulty and Oral airway inserted - appropriate to patient size Laryngoscope Size: Mac and 4 Grade View: Grade I Tube type: Oral Tube size: 7.0 mm Number of attempts: 1 Airway Equipment and Method: Stylet and Oral airway Placement Confirmation: ETT inserted through vocal cords under direct vision, positive ETCO2 and breath sounds checked- equal and bilateral Secured at: 22 cm Tube secured with: Tape Dental Injury: Teeth and Oropharynx as per pre-operative assessment

## 2022-06-18 NOTE — Transfer of Care (Signed)
Immediate Anesthesia Transfer of Care Note  Patient: Kelly [name]  Procedure(s) Performed: HYSTERECTOMY VAGINAL WITH BILATERAL SALPINGECTOMY (Bilateral: Uterus)  Patient Location: PACU  Anesthesia Type:General  Level of Consciousness: awake, alert , and oriented  Airway & Oxygen Therapy: Patient Spontanous Breathing  Post-op Assessment: Report given to RN and Post -op Vital signs reviewed and stable  Post vital signs: Reviewed and stable  Last Vitals:  Vitals Value Taken Time  BP 129/82 06/18/22 1002  Temp    Pulse 75 06/18/22 1004  Resp 10 06/18/22 1004  SpO2 92 % 06/18/22 1004  Vitals shown include unvalidated device data.  Last Pain:  Vitals:   06/18/22 0708  TempSrc:   PainSc: 0-No pain         Complications: There were no known notable events for this encounter.

## 2022-06-19 ENCOUNTER — Encounter (HOSPITAL_COMMUNITY): Payer: Self-pay | Admitting: Family Medicine

## 2022-06-19 ENCOUNTER — Other Ambulatory Visit: Payer: Self-pay

## 2022-06-19 DIAGNOSIS — N870 Mild cervical dysplasia: Secondary | ICD-10-CM | POA: Diagnosis not present

## 2022-06-19 DIAGNOSIS — N920 Excessive and frequent menstruation with regular cycle: Secondary | ICD-10-CM | POA: Diagnosis not present

## 2022-06-19 DIAGNOSIS — G4730 Sleep apnea, unspecified: Secondary | ICD-10-CM | POA: Diagnosis not present

## 2022-06-19 DIAGNOSIS — R519 Headache, unspecified: Secondary | ICD-10-CM | POA: Diagnosis not present

## 2022-06-19 DIAGNOSIS — N888 Other specified noninflammatory disorders of cervix uteri: Secondary | ICD-10-CM | POA: Diagnosis not present

## 2022-06-19 DIAGNOSIS — Z6839 Body mass index (BMI) 39.0-39.9, adult: Secondary | ICD-10-CM | POA: Diagnosis not present

## 2022-06-19 DIAGNOSIS — N939 Abnormal uterine and vaginal bleeding, unspecified: Secondary | ICD-10-CM | POA: Diagnosis not present

## 2022-06-19 DIAGNOSIS — F418 Other specified anxiety disorders: Secondary | ICD-10-CM | POA: Diagnosis not present

## 2022-06-19 LAB — CBC
HCT: 32.5 % — ABNORMAL LOW (ref 36.0–46.0)
Hemoglobin: 10.5 g/dL — ABNORMAL LOW (ref 12.0–15.0)
MCH: 29.2 pg (ref 26.0–34.0)
MCHC: 32.3 g/dL (ref 30.0–36.0)
MCV: 90.3 fL (ref 80.0–100.0)
Platelets: 218 10*3/uL (ref 150–400)
RBC: 3.6 MIL/uL — ABNORMAL LOW (ref 3.87–5.11)
RDW: 12.8 % (ref 11.5–15.5)
WBC: 14.3 10*3/uL — ABNORMAL HIGH (ref 4.0–10.5)
nRBC: 0 % (ref 0.0–0.2)

## 2022-06-19 LAB — SURGICAL PATHOLOGY

## 2022-06-19 MED ORDER — IBUPROFEN 600 MG PO TABS
600.0000 mg | ORAL_TABLET | Freq: Four times a day (QID) | ORAL | 0 refills | Status: DC
  Filled 2022-06-19: qty 30, 8d supply, fill #0

## 2022-06-19 NOTE — Discharge Summary (Signed)
Physician Discharge Summary  Patient ID: Kelly [name] MRN: [id number] DOB/AGE: 09-13-1975 47 y.o.  Admit date: 06/18/2022 Discharge date: 06/19/2022  Admission Diagnoses:  Principal Problem:   Status post hysterectomy Active Problems:   Menorrhagia   Dysplasia of cervix, low grade (CIN 1)   Abnormal uterine bleeding   Discharge Diagnoses:  Same  Past Medical History:  Diagnosis Date   Anxiety    Depression    GERD (gastroesophageal reflux disease)    Headache    Hyperlipemia    Myofascial pain syndrome     Surgeries: Procedure(s): HYSTERECTOMY VAGINAL WITH BILATERAL SALPINGECTOMY on 06/18/2022   Consultants: None  Discharged Condition: Stable  Hospital Course: Jamy L Hartnell is an 47 y.o. female [id number] who was admitted 06/18/2022 with a chief complaint of abnormal uterine bleeding and abnormal pap smear who desired definitive treatment.  They were brought to the operating room on 06/18/2022 and underwent the above named procedures.    She was given perioperative antibiotics:  Anti-infectives (From admission, onward)    Start     Dose/Rate Route Frequency Ordered Stop   06/18/22 0700  ceFAZolin (ANCEF) IVPB 2g/100 mL premix        2 g 200 mL/hr over 30 Minutes Intravenous On call to O.R. 06/18/22 [id number] 06/18/22 [id number]     .   She was given sequential compression devices, early ambulation, and chemoprophylaxis for DVT prophylaxis.  She benefited maximally from their hospital stay and there were no complications. She was ambulating, voiding, tolerating po and deemed stable for discharge.  Recent vital signs:  Vitals:   06/18/22 2356 06/19/22 0441  BP: 121/66 118/61  Pulse: (!) 52 61  Resp: 17 18  Temp: 98.6 F (37 C) 98.4 F (36.9 C)  SpO2: 95% 99%   Discharge exam: Physical Examination: General appearance - alert, well appearing, and in no distress Chest - normal effort Heart - normal rate and regular rhythm Abdomen - soft, appropriately tender,  dressing is clean and dry Extremities - peripheral pulses normal, no pedal edema, no clubbing or cyanosis, Homan's sign negative bilaterally Recent laboratory studies:  Results for orders placed or performed during the hospital encounter of 06/18/22  CBC  Result Value Ref Range   WBC 9.6 4.0 - 10.5 K/uL   RBC 4.12 3.87 - 5.11 MIL/uL   Hemoglobin 12.0 12.0 - 15.0 g/dL   HCT 36.6 36.0 - 46.0 %   MCV 88.8 80.0 - 100.0 fL   MCH 29.1 26.0 - 34.0 pg   MCHC 32.8 30.0 - 36.0 g/dL   RDW 12.8 11.5 - 15.5 %   Platelets 228 150 - 400 K/uL   nRBC 0.0 0.0 - 0.2 %  Creatinine, serum  Result Value Ref Range   Creatinine, Ser 0.98 0.44 - 1.00 mg/dL   GFR, Estimated >60 >60 mL/min  CBC  Result Value Ref Range   WBC 14.3 (H) 4.0 - 10.5 K/uL   RBC 3.60 (L) 3.87 - 5.11 MIL/uL   Hemoglobin 10.5 (L) 12.0 - 15.0 g/dL   HCT 32.5 (L) 36.0 - 46.0 %   MCV 90.3 80.0 - 100.0 fL   MCH 29.2 26.0 - 34.0 pg   MCHC 32.3 30.0 - 36.0 g/dL   RDW 12.8 11.5 - 15.5 %   Platelets 218 150 - 400 K/uL   nRBC 0.0 0.0 - 0.2 %  Pregnancy, urine POC  Result Value Ref Range   Preg Test, Ur NEGATIVE NEGATIVE  ABO/Rh  Result Value Ref Range  ABO/RH(D)      O POS Performed at [facility], [place] [address], [place], [place] [address]     Discharge Medications:   Allergies as of 06/19/2022   No Known Allergies      Medication List     TAKE these medications    clonazePAM 0.5 MG tablet Commonly known as: KLONOPIN Take 0.25-0.5 mg by mouth daily as needed for anxiety.   dicyclomine 20 MG tablet Commonly known as: BENTYL Take 1 tablet (20 mg total) by mouth every six (6) hours as needed.   DULoxetine 60 MG capsule Commonly known as: CYMBALTA Take 120 mg by mouth at bedtime.   gabapentin 300 MG capsule Commonly known as: NEURONTIN Take 300 mg by mouth at bedtime.   ibuprofen 600 MG tablet Commonly known as: ADVIL Take 1 tablet (600 mg total) by mouth every 6 (six) hours.   Lurasidone HCl 60 MG  Tabs Take 60 mg by mouth daily with supper.   Nurtec 75 MG Tbdp Generic drug: Rimegepant Sulfate Take 1 tablet by mouth as needed. (Take 75 mg by mouth as needed.)   oxyCODONE-acetaminophen 5-325 MG tablet Commonly known as: PERCOCET/ROXICET Take 1-2 tablets by mouth every 6 (six) hours as needed.   topiramate 200 MG tablet Commonly known as: Topamax Take 1 tablet (200 mg total) by mouth daily.        Diagnostic Studies: No results found.  Disposition: Discharge disposition: 01-Home or Self Care       Discharge Instructions      Remove dressing in 72 hours   Complete by: As directed    Call MD for:  persistant nausea and vomiting   Complete by: As directed    Call MD for:  redness, tenderness, or signs of infection (pain, swelling, redness, odor or green/yellow discharge around incision site)   Complete by: As directed    Call MD for:  severe uncontrolled pain   Complete by: As directed    Call MD for:  temperature >100.4   Complete by: As directed    Diet - low sodium heart healthy   Complete by: As directed    Driving Restrictions   Complete by: As directed    None while taking narcotic pain meds   Increase activity slowly   Complete by: As directed    Lifting restrictions   Complete by: As directed    Nothing > 20 lbs x 6 weeks   Sexual Activity Restrictions   Complete by: As directed    None x 6 weeks        Follow-up [place] for [facility] at [facility] Follow up in 2 week(s).   Specialty: Obstetrics and Gynecology Why: postop check, they will call you with an appointment Contact information: [place] [phone]                 Signed: [name] 06/19/2022, 7:03 AM

## 2022-06-26 DIAGNOSIS — F411 Generalized anxiety disorder: Secondary | ICD-10-CM | POA: Diagnosis not present

## 2022-07-01 ENCOUNTER — Other Ambulatory Visit: Payer: Self-pay | Admitting: Family Medicine

## 2022-07-02 ENCOUNTER — Other Ambulatory Visit: Payer: Self-pay

## 2022-07-02 ENCOUNTER — Other Ambulatory Visit: Payer: Self-pay | Admitting: Family Medicine

## 2022-07-03 DIAGNOSIS — F411 Generalized anxiety disorder: Secondary | ICD-10-CM | POA: Diagnosis not present

## 2022-07-04 ENCOUNTER — Ambulatory Visit (INDEPENDENT_AMBULATORY_CARE_PROVIDER_SITE_OTHER): Payer: Commercial Managed Care - PPO | Admitting: Family Medicine

## 2022-07-04 ENCOUNTER — Other Ambulatory Visit: Payer: Self-pay

## 2022-07-04 ENCOUNTER — Encounter: Payer: Self-pay | Admitting: Family Medicine

## 2022-07-04 VITALS — BP 122/85 | HR 106 | Wt 253.0 lb

## 2022-07-04 DIAGNOSIS — R300 Dysuria: Secondary | ICD-10-CM | POA: Diagnosis not present

## 2022-07-04 DIAGNOSIS — Z90710 Acquired absence of both cervix and uterus: Secondary | ICD-10-CM

## 2022-07-04 LAB — POCT URINALYSIS DIPSTICK

## 2022-07-04 NOTE — Assessment & Plan Note (Signed)
Doing well, normal recovery discussed.

## 2022-07-04 NOTE — Progress Notes (Deleted)
    Subjective:    Patient ID: Kelly [name] is a 47 y.o. female presenting with Post-op Follow-up  on 07/04/2022  HPI: ***  Review of Systems  Constitutional:  Negative for chills and fever.  Respiratory:  Negative for shortness of breath.   Cardiovascular:  Negative for chest pain.  Gastrointestinal:  Negative for abdominal pain, nausea and vomiting.  Genitourinary:  Positive for pelvic pain (pressure with urination). Negative for dysuria.  Skin:  Negative for rash.      Objective:    BP 122/85   Pulse (!) 106   Wt 253 lb (114.8 kg)   LMP 06/05/2022 Comment: negative test 06/18/2022  BMI 39.63 kg/m  Physical Exam Exam conducted with a chaperone present.  Constitutional:      General: She is not in acute distress.    Appearance: She is well-developed.  HENT:     Head: Normocephalic and atraumatic.  Eyes:     General: No scleral icterus. Cardiovascular:     Rate and Rhythm: Normal rate.  Pulmonary:     Effort: Pulmonary effort is normal.  Abdominal:     Palpations: Abdomen is soft.  Musculoskeletal:     Cervical back: Neck supple.  Skin:    General: Skin is warm and dry.  Neurological:     Mental Status: She is alert and oriented to person, place, and time.         Assessment & Plan:   Total time in review of prior notes, pathology, labs, history taking, review with patient, exam, note writing, discussion of options, plan for next steps, alternatives and risks of treatment: *** minutes.  No follow-ups on file.  [name], MD 07/04/2022 9:16 AM

## 2022-07-04 NOTE — Progress Notes (Signed)
CC: Pressure during urination

## 2022-07-04 NOTE — Progress Notes (Signed)
    Subjective:    Patient ID: Kelly [name] is a 47 y.o. female presenting with Post-op Follow-up  on 07/04/2022  HPI: Pt. Is s/p TVH on 06/18/22. She is doing well. Has some pressure with urination, but no dysuria.  Review of Systems  Constitutional:  Negative for chills and fever.  Respiratory:  Negative for shortness of breath.   Cardiovascular:  Negative for chest pain.  Gastrointestinal:  Negative for abdominal pain, nausea and vomiting.  Genitourinary:  Positive for pelvic pain (pressure with urination). Negative for dysuria.  Skin:  Negative for rash.      Objective:    BP 122/85   Pulse (!) 106   Wt 253 lb (114.8 kg)   LMP 06/05/2022 Comment: negative test 06/18/2022  BMI 39.63 kg/m  Physical Exam Exam conducted with a chaperone present.  Constitutional:      General: She is not in acute distress.    Appearance: She is well-developed.  HENT:     Head: Normocephalic and atraumatic.  Eyes:     General: No scleral icterus. Cardiovascular:     Rate and Rhythm: Normal rate.  Pulmonary:     Effort: Pulmonary effort is normal.  Abdominal:     Palpations: Abdomen is soft.  Musculoskeletal:     Cervical back: Neck supple.  Skin:    General: Skin is warm and dry.  Neurological:     Mental Status: She is alert and oriented to person, place, and time.    Urinalysis    Component Value Date/Time   BILIRUBINUR neg 12/27/2020 1207   PROTEINUR Negative 12/27/2020 1207   UROBILINOGEN 0.2 12/27/2020 1207   NITRITE neg 12/27/2020 1207   LEUKOCYTESUR Moderate (2+) (A) 07/04/2022 0920          Assessment & Plan:   Problem List Items Addressed This Visit       Unprioritized   Status post hysterectomy    Doing well, normal recovery discussed.      Other Visit Diagnoses     Dysuria    -  Primary   u/a is dirty, check culture as she is post op   Relevant Orders   POCT Urinalysis Dipstick (Completed)   Urine Culture        Return in about 4 weeks  (around 08/01/2022) for postop check.  [name], MD 07/04/2022 9:13 AM

## 2022-07-05 ENCOUNTER — Other Ambulatory Visit: Payer: Self-pay

## 2022-07-05 LAB — URINE CULTURE

## 2022-07-10 DIAGNOSIS — F411 Generalized anxiety disorder: Secondary | ICD-10-CM | POA: Diagnosis not present

## 2022-07-11 ENCOUNTER — Other Ambulatory Visit: Payer: Self-pay

## 2022-07-17 ENCOUNTER — Encounter: Payer: Self-pay | Admitting: *Deleted

## 2022-07-17 DIAGNOSIS — F411 Generalized anxiety disorder: Secondary | ICD-10-CM | POA: Diagnosis not present

## 2022-07-24 ENCOUNTER — Encounter: Payer: Self-pay | Admitting: Family Medicine

## 2022-07-24 ENCOUNTER — Other Ambulatory Visit: Payer: Self-pay

## 2022-07-24 ENCOUNTER — Ambulatory Visit: Payer: Commercial Managed Care - PPO | Admitting: Family Medicine

## 2022-07-24 VITALS — BP 107/75 | HR 101 | Wt 254.0 lb

## 2022-07-24 DIAGNOSIS — Z09 Encounter for follow-up examination after completed treatment for conditions other than malignant neoplasm: Secondary | ICD-10-CM

## 2022-07-24 DIAGNOSIS — E559 Vitamin D deficiency, unspecified: Secondary | ICD-10-CM

## 2022-07-24 DIAGNOSIS — Z90710 Acquired absence of both cervix and uterus: Secondary | ICD-10-CM

## 2022-07-24 MED ORDER — VITAMIN D (ERGOCALCIFEROL) 1.25 MG (50000 UNIT) PO CAPS
50000.0000 [IU] | ORAL_CAPSULE | ORAL | 0 refills | Status: DC
  Filled 2022-07-24: qty 8, 56d supply, fill #0

## 2022-07-24 NOTE — Progress Notes (Signed)
   Subjective:    Patient ID: Kelly [name] is a 47 y.o. female presenting with Routine Post Op  on 07/24/2022  HPI: Patient is s/p [facility] 2/27. Doing well. No significant complaints excpet for fatigue. Has h/o vitamin D deficiency.  Review of Systems  Constitutional:  Negative for chills and fever.  Respiratory:  Negative for shortness of breath.   Cardiovascular:  Negative for chest pain.  Gastrointestinal:  Negative for abdominal pain, nausea and vomiting.  Genitourinary:  Negative for dysuria.  Skin:  Negative for rash.      Objective:    BP 107/75   Pulse (!) 101   Wt 254 lb (115.2 kg)   LMP 06/05/2022 Comment: negative test 06/18/2022  BMI 39.78 kg/m  Physical Exam Exam conducted with a chaperone present.  Constitutional:      General: She is not in acute distress.    Appearance: She is well-developed.  HENT:     Head: Normocephalic and atraumatic.  Eyes:     General: No scleral icterus. Cardiovascular:     Rate and Rhythm: Normal rate.  Pulmonary:     Effort: Pulmonary effort is normal.  Abdominal:     Palpations: Abdomen is soft.  Genitourinary:    Comments: Vaginal cuff intact Musculoskeletal:     Cervical back: Neck supple.  Skin:    General: Skin is warm and dry.  Neurological:     Mental Status: She is alert and oriented to person, place, and time.         Assessment & Plan:   Problem List Items Addressed This Visit       Unprioritized   Status post hysterectomy    Doing well. Refrain from sex for 2-3 more weeks. Go slow at first.       Other Visit Diagnoses     Vitamin D deficiency    -  Primary   refilled rx for weekly repletion.   Relevant Medications   Vitamin D, Ergocalciferol, (DRISDOL) 1.25 MG (50000 UNIT) CAPS capsule        Return if symptoms worsen or fail to improve.  [name], MD 07/24/2022 11:37 AM

## 2022-07-24 NOTE — Assessment & Plan Note (Signed)
Doing well. Refrain from sex for 2-3 more weeks. Go slow at first.

## 2022-07-24 NOTE — Progress Notes (Signed)
RGYN patient presents for Post op visit today following  Transvaginal hysterectomy with bilateral salpingectomy on 06/18/22.  [id number]

## 2022-07-25 DIAGNOSIS — F411 Generalized anxiety disorder: Secondary | ICD-10-CM | POA: Diagnosis not present

## 2022-07-30 DIAGNOSIS — F411 Generalized anxiety disorder: Secondary | ICD-10-CM | POA: Diagnosis not present

## 2022-08-05 DIAGNOSIS — F411 Generalized anxiety disorder: Secondary | ICD-10-CM | POA: Diagnosis not present

## 2022-08-19 DIAGNOSIS — F411 Generalized anxiety disorder: Secondary | ICD-10-CM | POA: Diagnosis not present

## 2022-08-29 DIAGNOSIS — F411 Generalized anxiety disorder: Secondary | ICD-10-CM | POA: Diagnosis not present

## 2022-09-03 DIAGNOSIS — F411 Generalized anxiety disorder: Secondary | ICD-10-CM | POA: Diagnosis not present

## 2022-09-10 ENCOUNTER — Other Ambulatory Visit: Payer: Self-pay

## 2022-09-12 DIAGNOSIS — F411 Generalized anxiety disorder: Secondary | ICD-10-CM | POA: Diagnosis not present

## 2022-09-16 DIAGNOSIS — F411 Generalized anxiety disorder: Secondary | ICD-10-CM | POA: Diagnosis not present

## 2022-09-26 DIAGNOSIS — F411 Generalized anxiety disorder: Secondary | ICD-10-CM | POA: Diagnosis not present

## 2022-10-01 DIAGNOSIS — F411 Generalized anxiety disorder: Secondary | ICD-10-CM | POA: Diagnosis not present

## 2022-10-08 ENCOUNTER — Other Ambulatory Visit: Payer: Self-pay | Admitting: Physician Assistant

## 2022-10-08 ENCOUNTER — Other Ambulatory Visit: Payer: Self-pay

## 2022-10-08 MED FILL — Ibuprofen Tab 600 MG: ORAL | 8 days supply | Qty: 30 | Fill #0 | Status: AC

## 2022-10-09 ENCOUNTER — Other Ambulatory Visit: Payer: Self-pay

## 2022-10-10 DIAGNOSIS — F411 Generalized anxiety disorder: Secondary | ICD-10-CM | POA: Diagnosis not present

## 2022-10-14 DIAGNOSIS — F411 Generalized anxiety disorder: Secondary | ICD-10-CM | POA: Diagnosis not present

## 2022-10-15 ENCOUNTER — Other Ambulatory Visit: Payer: Self-pay

## 2022-10-16 ENCOUNTER — Other Ambulatory Visit: Payer: Self-pay | Admitting: Physician Assistant

## 2022-10-16 DIAGNOSIS — F332 Major depressive disorder, recurrent severe without psychotic features: Secondary | ICD-10-CM | POA: Diagnosis not present

## 2022-10-16 DIAGNOSIS — F411 Generalized anxiety disorder: Secondary | ICD-10-CM | POA: Diagnosis not present

## 2022-10-16 DIAGNOSIS — F4312 Post-traumatic stress disorder, chronic: Secondary | ICD-10-CM | POA: Diagnosis not present

## 2022-10-17 ENCOUNTER — Other Ambulatory Visit: Payer: Self-pay

## 2022-10-17 ENCOUNTER — Other Ambulatory Visit: Payer: Self-pay | Admitting: Physician Assistant

## 2022-10-18 ENCOUNTER — Other Ambulatory Visit: Payer: Self-pay

## 2022-10-18 MED FILL — Topiramate Tab 200 MG: ORAL | 30 days supply | Qty: 30 | Fill #0 | Status: AC

## 2022-10-18 MED FILL — Rimegepant Sulfate Tab Disint 75 MG: ORAL | 30 days supply | Qty: 8 | Fill #0 | Status: CN

## 2022-10-21 ENCOUNTER — Other Ambulatory Visit: Payer: Self-pay

## 2022-10-21 MED FILL — Rimegepant Sulfate Tab Disint 75 MG: ORAL | 16 days supply | Qty: 8 | Fill #0 | Status: CN

## 2022-10-23 ENCOUNTER — Other Ambulatory Visit: Payer: Self-pay

## 2022-10-23 MED FILL — Rimegepant Sulfate Tab Disint 75 MG: ORAL | 16 days supply | Qty: 8 | Fill #0 | Status: CN

## 2022-10-24 DIAGNOSIS — F411 Generalized anxiety disorder: Secondary | ICD-10-CM | POA: Diagnosis not present

## 2022-10-29 DIAGNOSIS — F411 Generalized anxiety disorder: Secondary | ICD-10-CM | POA: Diagnosis not present

## 2022-10-31 ENCOUNTER — Other Ambulatory Visit: Payer: Self-pay

## 2022-10-31 MED FILL — Rimegepant Sulfate Tab Disint 75 MG: ORAL | 30 days supply | Qty: 8 | Fill #0 | Status: CN

## 2022-11-04 ENCOUNTER — Encounter: Payer: Self-pay | Admitting: *Deleted

## 2022-11-04 ENCOUNTER — Other Ambulatory Visit: Payer: Self-pay

## 2022-11-04 MED FILL — Rimegepant Sulfate Tab Disint 75 MG: ORAL | 16 days supply | Qty: 8 | Fill #0 | Status: AC

## 2022-11-05 ENCOUNTER — Other Ambulatory Visit: Payer: Self-pay

## 2022-11-07 DIAGNOSIS — F411 Generalized anxiety disorder: Secondary | ICD-10-CM | POA: Diagnosis not present

## 2022-11-12 DIAGNOSIS — F411 Generalized anxiety disorder: Secondary | ICD-10-CM | POA: Diagnosis not present

## 2022-11-15 ENCOUNTER — Other Ambulatory Visit: Payer: Self-pay

## 2022-11-15 MED FILL — Rimegepant Sulfate Tab Disint 75 MG: ORAL | 16 days supply | Qty: 8 | Fill #1 | Status: AC

## 2022-11-15 MED FILL — Topiramate Tab 200 MG: ORAL | 30 days supply | Qty: 30 | Fill #1 | Status: AC

## 2022-11-21 DIAGNOSIS — F411 Generalized anxiety disorder: Secondary | ICD-10-CM | POA: Diagnosis not present

## 2022-11-26 DIAGNOSIS — F411 Generalized anxiety disorder: Secondary | ICD-10-CM | POA: Diagnosis not present

## 2022-11-27 DIAGNOSIS — F4312 Post-traumatic stress disorder, chronic: Secondary | ICD-10-CM | POA: Diagnosis not present

## 2022-11-27 DIAGNOSIS — F411 Generalized anxiety disorder: Secondary | ICD-10-CM | POA: Diagnosis not present

## 2022-11-27 DIAGNOSIS — F332 Major depressive disorder, recurrent severe without psychotic features: Secondary | ICD-10-CM | POA: Diagnosis not present

## 2022-12-05 DIAGNOSIS — F411 Generalized anxiety disorder: Secondary | ICD-10-CM | POA: Diagnosis not present

## 2022-12-10 DIAGNOSIS — F411 Generalized anxiety disorder: Secondary | ICD-10-CM | POA: Diagnosis not present

## 2022-12-17 MED FILL — Topiramate Tab 200 MG: ORAL | 30 days supply | Qty: 30 | Fill #2 | Status: CN

## 2022-12-17 MED FILL — Rimegepant Sulfate Tab Disint 75 MG: ORAL | 16 days supply | Qty: 8 | Fill #2 | Status: CN

## 2022-12-19 DIAGNOSIS — F411 Generalized anxiety disorder: Secondary | ICD-10-CM | POA: Diagnosis not present

## 2022-12-24 DIAGNOSIS — F411 Generalized anxiety disorder: Secondary | ICD-10-CM | POA: Diagnosis not present

## 2022-12-31 ENCOUNTER — Other Ambulatory Visit: Payer: Self-pay

## 2023-01-01 ENCOUNTER — Other Ambulatory Visit: Payer: Self-pay

## 2023-01-01 MED ORDER — VRAYLAR 3 MG PO CAPS
3.0000 mg | ORAL_CAPSULE | Freq: Every day | ORAL | 0 refills | Status: DC
  Filled 2023-01-01: qty 30, 30d supply, fill #0

## 2023-01-01 MED FILL — Rimegepant Sulfate Tab Disint 75 MG: ORAL | 16 days supply | Qty: 8 | Fill #2 | Status: AC

## 2023-01-01 MED FILL — Topiramate Tab 200 MG: ORAL | 30 days supply | Qty: 30 | Fill #2 | Status: AC

## 2023-01-02 ENCOUNTER — Other Ambulatory Visit: Payer: Self-pay

## 2023-01-03 DIAGNOSIS — F411 Generalized anxiety disorder: Secondary | ICD-10-CM | POA: Diagnosis not present

## 2023-01-07 DIAGNOSIS — F411 Generalized anxiety disorder: Secondary | ICD-10-CM | POA: Diagnosis not present

## 2023-01-17 DIAGNOSIS — F411 Generalized anxiety disorder: Secondary | ICD-10-CM | POA: Diagnosis not present

## 2023-01-21 DIAGNOSIS — F411 Generalized anxiety disorder: Secondary | ICD-10-CM | POA: Diagnosis not present

## 2023-01-24 ENCOUNTER — Other Ambulatory Visit: Payer: Self-pay

## 2023-01-24 MED FILL — Rimegepant Sulfate Tab Disint 75 MG: ORAL | 16 days supply | Qty: 8 | Fill #3 | Status: AC

## 2023-01-26 ENCOUNTER — Other Ambulatory Visit: Payer: Self-pay

## 2023-01-28 ENCOUNTER — Other Ambulatory Visit: Payer: Self-pay

## 2023-01-28 DIAGNOSIS — F411 Generalized anxiety disorder: Secondary | ICD-10-CM | POA: Diagnosis not present

## 2023-01-30 ENCOUNTER — Other Ambulatory Visit: Payer: Self-pay

## 2023-01-30 DIAGNOSIS — F411 Generalized anxiety disorder: Secondary | ICD-10-CM | POA: Diagnosis not present

## 2023-01-31 ENCOUNTER — Other Ambulatory Visit: Payer: Self-pay

## 2023-02-02 ENCOUNTER — Other Ambulatory Visit: Payer: Self-pay

## 2023-02-04 ENCOUNTER — Other Ambulatory Visit: Payer: Self-pay

## 2023-02-04 DIAGNOSIS — F411 Generalized anxiety disorder: Secondary | ICD-10-CM | POA: Diagnosis not present

## 2023-02-05 ENCOUNTER — Other Ambulatory Visit: Payer: Self-pay

## 2023-02-06 ENCOUNTER — Other Ambulatory Visit: Payer: Self-pay

## 2023-02-06 DIAGNOSIS — Z1152 Encounter for screening for COVID-19: Secondary | ICD-10-CM | POA: Diagnosis not present

## 2023-02-06 DIAGNOSIS — G4733 Obstructive sleep apnea (adult) (pediatric): Secondary | ICD-10-CM | POA: Diagnosis not present

## 2023-02-06 DIAGNOSIS — F331 Major depressive disorder, recurrent, moderate: Secondary | ICD-10-CM | POA: Diagnosis not present

## 2023-02-06 DIAGNOSIS — F419 Anxiety disorder, unspecified: Secondary | ICD-10-CM | POA: Diagnosis not present

## 2023-02-06 DIAGNOSIS — K219 Gastro-esophageal reflux disease without esophagitis: Secondary | ICD-10-CM | POA: Diagnosis not present

## 2023-02-06 DIAGNOSIS — F339 Major depressive disorder, recurrent, unspecified: Secondary | ICD-10-CM | POA: Diagnosis not present

## 2023-02-06 MED ORDER — VRAYLAR 3 MG PO CAPS
3.0000 mg | ORAL_CAPSULE | Freq: Every day | ORAL | 0 refills | Status: DC
  Filled 2023-02-06: qty 30, 30d supply, fill #0

## 2023-02-11 DIAGNOSIS — F411 Generalized anxiety disorder: Secondary | ICD-10-CM | POA: Diagnosis not present

## 2023-02-13 DIAGNOSIS — F411 Generalized anxiety disorder: Secondary | ICD-10-CM | POA: Diagnosis not present

## 2023-02-18 ENCOUNTER — Other Ambulatory Visit: Payer: Self-pay

## 2023-02-18 DIAGNOSIS — F332 Major depressive disorder, recurrent severe without psychotic features: Secondary | ICD-10-CM | POA: Diagnosis not present

## 2023-02-18 DIAGNOSIS — F411 Generalized anxiety disorder: Secondary | ICD-10-CM | POA: Diagnosis not present

## 2023-02-18 DIAGNOSIS — Z79891 Long term (current) use of opiate analgesic: Secondary | ICD-10-CM | POA: Diagnosis not present

## 2023-02-18 DIAGNOSIS — F4312 Post-traumatic stress disorder, chronic: Secondary | ICD-10-CM | POA: Diagnosis not present

## 2023-02-18 DIAGNOSIS — F331 Major depressive disorder, recurrent, moderate: Secondary | ICD-10-CM | POA: Diagnosis not present

## 2023-02-18 MED ORDER — DULOXETINE HCL 60 MG PO CPEP
120.0000 mg | ORAL_CAPSULE | Freq: Every day | ORAL | 0 refills | Status: DC
  Filled 2023-02-18: qty 180, 90d supply, fill #0

## 2023-02-18 MED ORDER — VRAYLAR 4.5 MG PO CAPS
4.5000 mg | ORAL_CAPSULE | Freq: Every day | ORAL | 2 refills | Status: DC
  Filled 2023-02-18: qty 30, 30d supply, fill #0
  Filled 2023-03-17: qty 30, 30d supply, fill #1

## 2023-02-18 MED ORDER — GABAPENTIN 300 MG PO CAPS
300.0000 mg | ORAL_CAPSULE | Freq: Every day | ORAL | 2 refills | Status: DC
  Filled 2023-02-18: qty 120, 30d supply, fill #0
  Filled 2023-03-17: qty 120, 30d supply, fill #1
  Filled 2023-04-20: qty 120, 30d supply, fill #2

## 2023-02-18 MED ORDER — CLONAZEPAM 1 MG PO TABS
1.0000 mg | ORAL_TABLET | Freq: Every day | ORAL | 2 refills | Status: DC | PRN
  Filled 2023-05-29 – 2023-07-18 (×2): qty 30, 30d supply, fill #0

## 2023-02-18 MED ORDER — TRAZODONE HCL 50 MG PO TABS
25.0000 mg | ORAL_TABLET | Freq: Every day | ORAL | 2 refills | Status: DC
  Filled 2023-02-18: qty 60, 30d supply, fill #0
  Filled 2023-03-17: qty 60, 30d supply, fill #1
  Filled 2023-04-20: qty 60, 30d supply, fill #2

## 2023-02-18 MED ORDER — BUSPIRONE HCL 15 MG PO TABS
15.0000 mg | ORAL_TABLET | Freq: Two times a day (BID) | ORAL | 2 refills | Status: DC
  Filled 2023-02-18: qty 60, 30d supply, fill #0
  Filled 2023-03-17: qty 60, 30d supply, fill #1
  Filled 2023-04-20: qty 60, 30d supply, fill #2

## 2023-02-25 ENCOUNTER — Other Ambulatory Visit: Payer: Self-pay

## 2023-02-25 MED ORDER — LITHIUM CARBONATE 300 MG PO CAPS
300.0000 mg | ORAL_CAPSULE | Freq: Two times a day (BID) | ORAL | 0 refills | Status: DC
  Filled 2023-02-25: qty 60, 30d supply, fill #0

## 2023-02-26 MED FILL — Rimegepant Sulfate Tab Disint 75 MG: ORAL | 16 days supply | Qty: 8 | Fill #4 | Status: AC

## 2023-02-27 DIAGNOSIS — F411 Generalized anxiety disorder: Secondary | ICD-10-CM | POA: Diagnosis not present

## 2023-03-03 MED FILL — Topiramate Tab 200 MG: ORAL | 30 days supply | Qty: 30 | Fill #3 | Status: AC

## 2023-03-04 ENCOUNTER — Other Ambulatory Visit: Payer: Self-pay

## 2023-03-04 DIAGNOSIS — F411 Generalized anxiety disorder: Secondary | ICD-10-CM | POA: Diagnosis not present

## 2023-03-14 DIAGNOSIS — F411 Generalized anxiety disorder: Secondary | ICD-10-CM | POA: Diagnosis not present

## 2023-03-18 DIAGNOSIS — F411 Generalized anxiety disorder: Secondary | ICD-10-CM | POA: Diagnosis not present

## 2023-03-19 ENCOUNTER — Other Ambulatory Visit: Payer: Self-pay

## 2023-03-19 DIAGNOSIS — F4312 Post-traumatic stress disorder, chronic: Secondary | ICD-10-CM | POA: Diagnosis not present

## 2023-03-19 DIAGNOSIS — F411 Generalized anxiety disorder: Secondary | ICD-10-CM | POA: Diagnosis not present

## 2023-03-19 DIAGNOSIS — F332 Major depressive disorder, recurrent severe without psychotic features: Secondary | ICD-10-CM | POA: Diagnosis not present

## 2023-03-19 MED ORDER — BENZTROPINE MESYLATE 1 MG PO TABS
1.0000 mg | ORAL_TABLET | Freq: Two times a day (BID) | ORAL | 1 refills | Status: DC
  Filled 2023-03-19: qty 60, 30d supply, fill #0
  Filled 2023-04-14: qty 60, 30d supply, fill #1

## 2023-03-19 MED ORDER — LITHIUM CARBONATE 300 MG PO CAPS
300.0000 mg | ORAL_CAPSULE | Freq: Two times a day (BID) | ORAL | 1 refills | Status: DC
  Filled 2023-03-19: qty 60, 30d supply, fill #0
  Filled 2023-04-20: qty 60, 30d supply, fill #1

## 2023-03-22 DIAGNOSIS — F411 Generalized anxiety disorder: Secondary | ICD-10-CM | POA: Diagnosis not present

## 2023-03-24 ENCOUNTER — Other Ambulatory Visit: Payer: Self-pay

## 2023-03-24 MED ORDER — VRAYLAR 4.5 MG PO CAPS
4.5000 mg | ORAL_CAPSULE | Freq: Every day | ORAL | 2 refills | Status: DC
  Filled 2023-04-14: qty 30, 30d supply, fill #0
  Filled 2023-05-14: qty 30, 30d supply, fill #1
  Filled 2023-05-29 – 2023-06-14 (×2): qty 30, 30d supply, fill #2

## 2023-03-27 DIAGNOSIS — F332 Major depressive disorder, recurrent severe without psychotic features: Secondary | ICD-10-CM | POA: Diagnosis not present

## 2023-03-27 DIAGNOSIS — F411 Generalized anxiety disorder: Secondary | ICD-10-CM | POA: Diagnosis not present

## 2023-03-29 MED FILL — Topiramate Tab 200 MG: ORAL | 30 days supply | Qty: 30 | Fill #4 | Status: AC

## 2023-03-29 MED FILL — Rimegepant Sulfate Tab Disint 75 MG: ORAL | 16 days supply | Qty: 8 | Fill #5 | Status: AC

## 2023-04-01 DIAGNOSIS — F411 Generalized anxiety disorder: Secondary | ICD-10-CM | POA: Diagnosis not present

## 2023-04-02 IMAGING — MR MR CERVICAL SPINE W/O CM
5 series · 41 of 48 positions shown · non-contrast
Comparison: Cervical spine radiographs 12/10/2020

CLINICAL DATA: Neck pain. Bilateral upper extremity numbness and
tingling.

EXAM:
MRI CERVICAL SPINE WITHOUT CONTRAST
TECHNIQUE: Multiplanar, multisequence MR imaging of the cervical spine was
performed. No intravenous contrast was administered.

[Series 5: T2 · sagittal · 3.0mm · 0.62mm/px · 6 of 15 slices shown (1 of 2)]
[im 1/15]
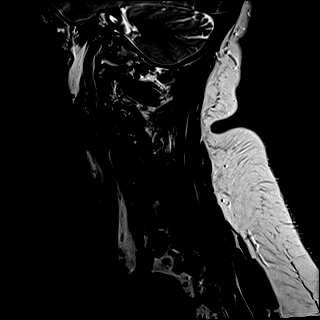
[im 3/15]
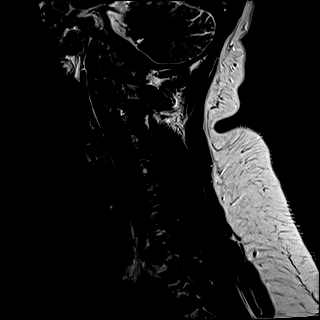
[im 6/15]
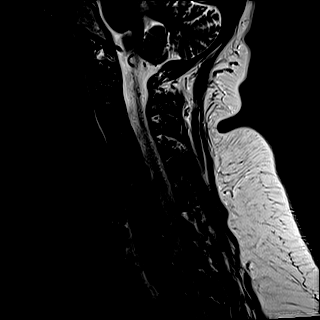
[im 9/15]
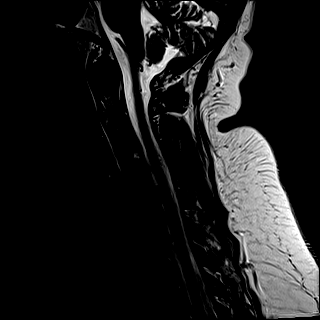
[im 12/15]
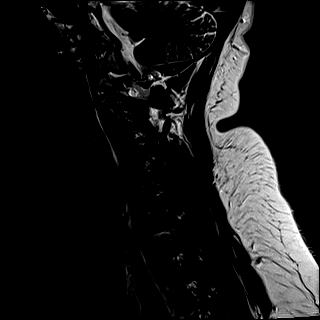
[im 15/15]
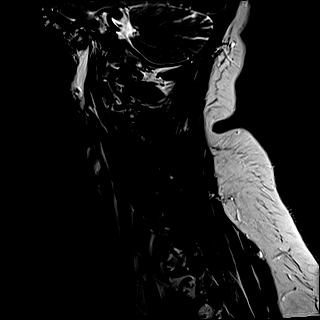

[Series 6: FLAIR · sagittal · 3.0mm · 0.78mm/px · 7 of 15 slices shown]
[im 1/15]
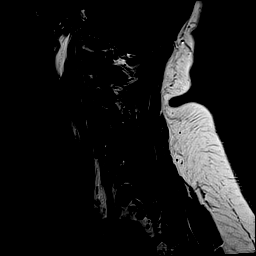
[im 3/15]
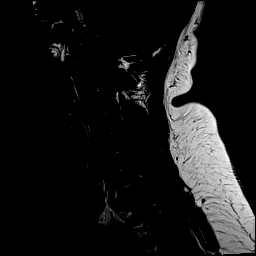
[im 5/15]
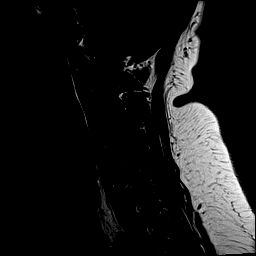
[im 8/15]
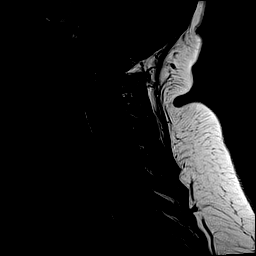
[im 10/15]
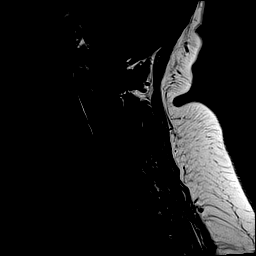
[im 12/15]
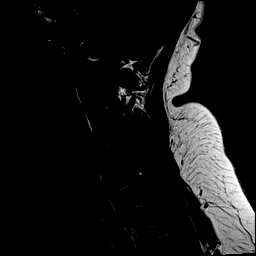
[im 15/15]
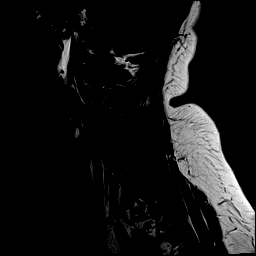

[Series 7: STIR · sagittal · 3.0mm · 0.62mm/px · 7 of 15 slices shown]
[im 1/15]
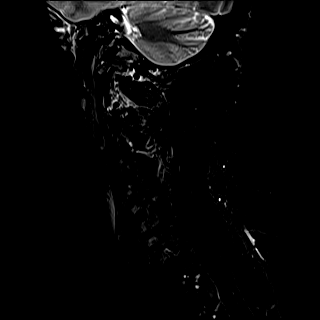
[im 3/15]
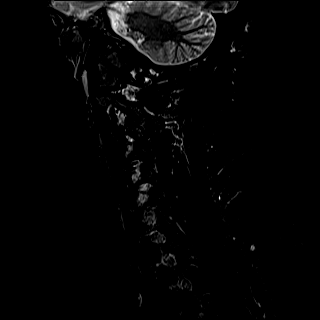
[im 5/15]
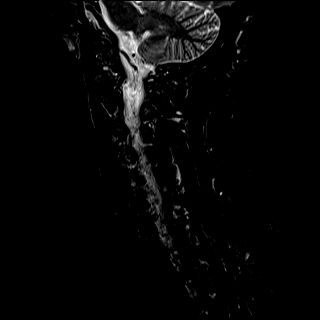
[im 8/15]
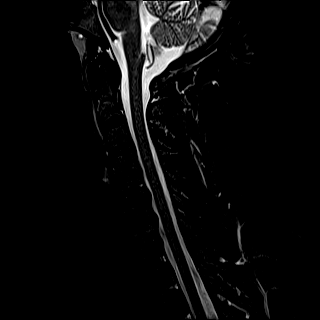
[im 10/15]
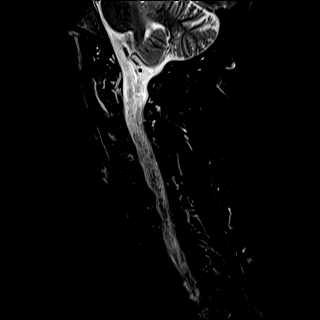
[im 12/15]
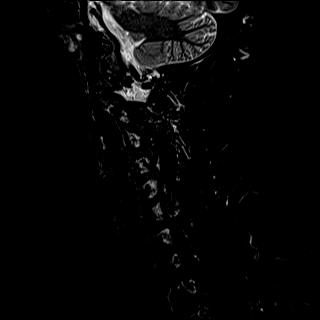
[im 15/15]
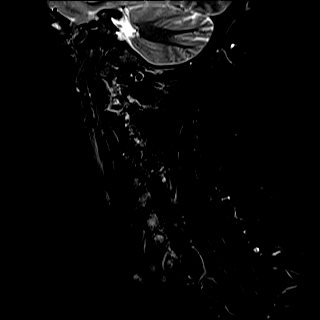

[Series 8: T2 · axial · 3.0mm · 0.70mm/px · z∈[-191,-95]mm · 13 of 29 slices shown (2 of 2)]
[im 1/29]
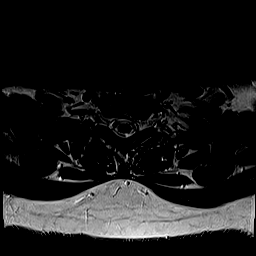
[im 3/29]
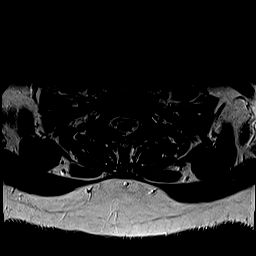
[im 5/29]
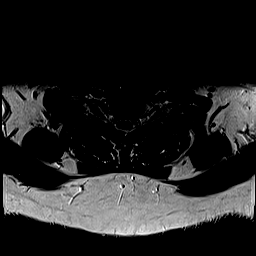
[im 7/29]
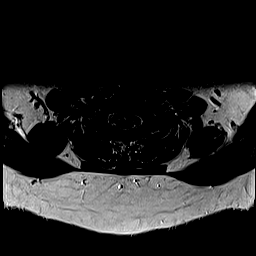
[im 9/29]
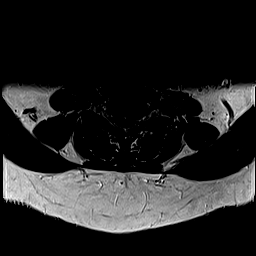
[im 11/29]
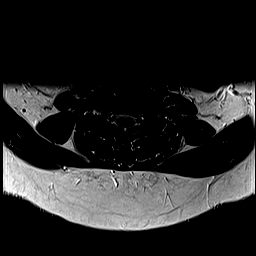
[im 13/29]
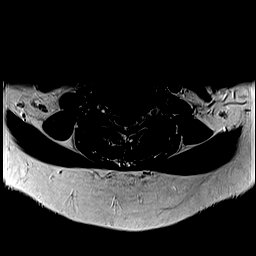
[im 16/29]
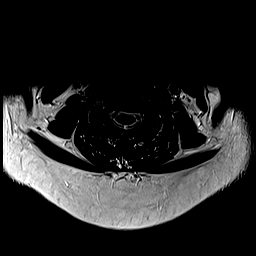
[im 18/29]
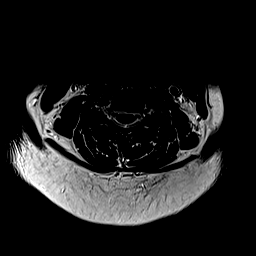
[im 20/29]
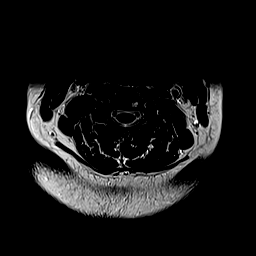
[im 22/29]
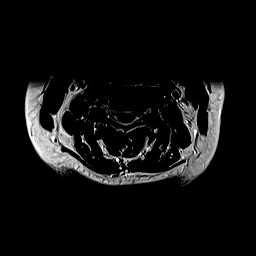
[im 24/29]
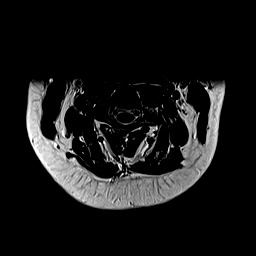
[im 29/29]
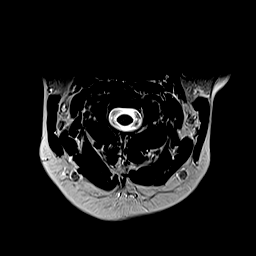

[Series 9: ax mpgr · axial · 3.0mm · 0.35mm/px · z∈[-191,-95]mm · 8 of 29 slices shown]
[im 1/29]
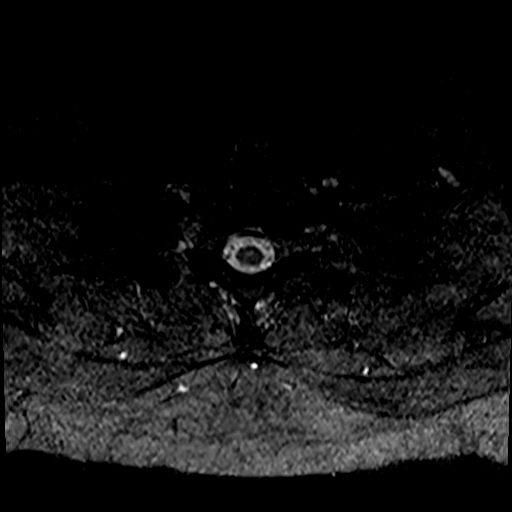
[im 5/29]
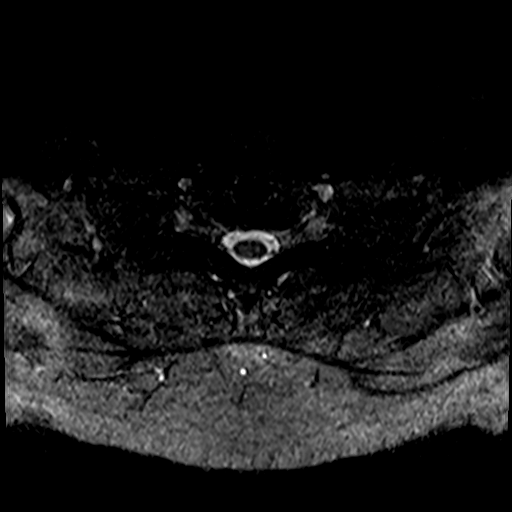
[im 9/29]
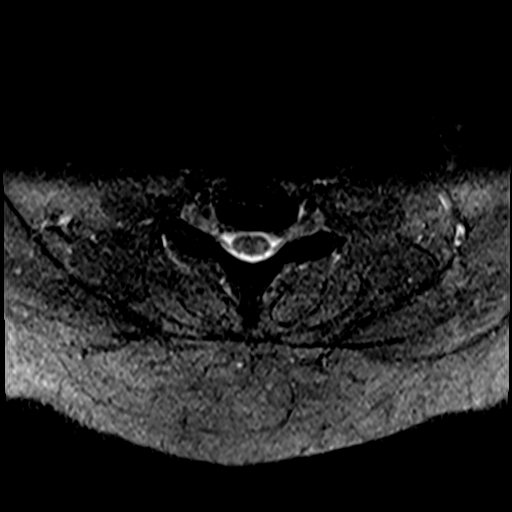
[im 13/29]
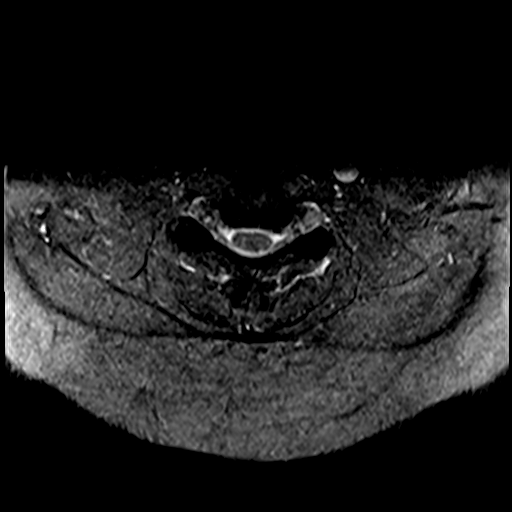
[im 16/29]
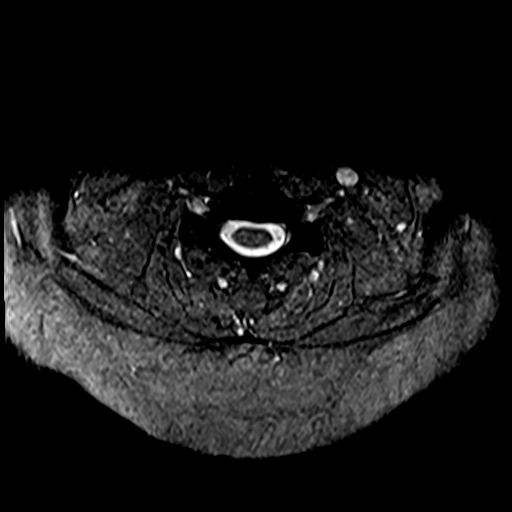
[im 20/29]
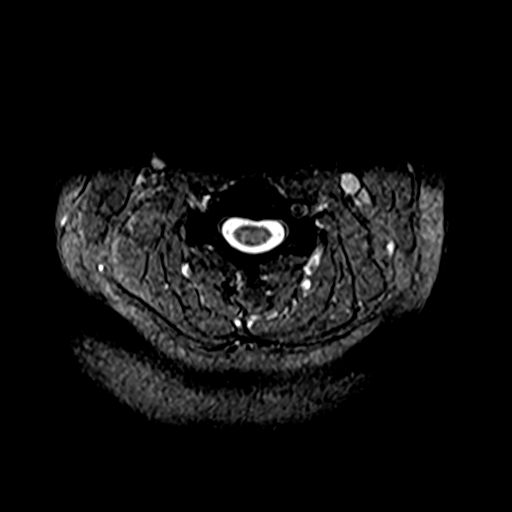
[im 24/29]
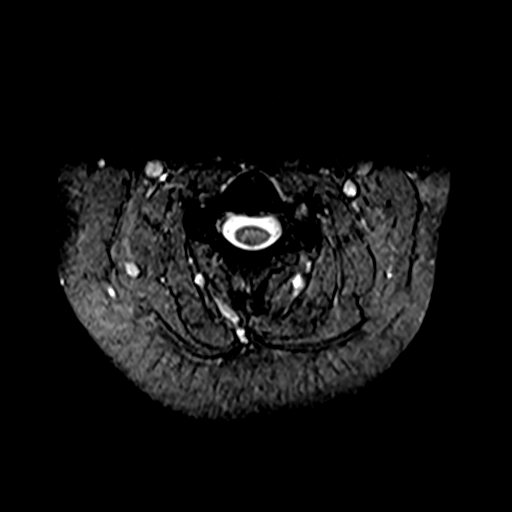
[im 29/29]
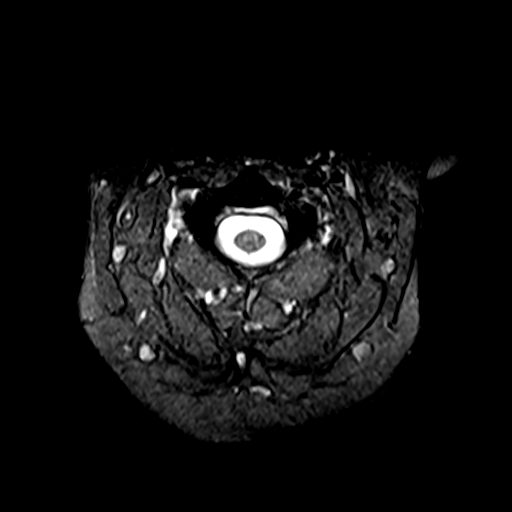

[41 of 48 positions shown; findings below may reference images not displayed]

FINDINGS: Alignment: Straightening/slight reversal of the normal cervical
lordosis. No listhesis.

Vertebrae: No fracture, suspicious marrow lesion, or significant
marrow edema. Suspected small hemangioma in the C4 vertebral body.

Cord: Normal signal and morphology.

Posterior Fossa, vertebral arteries, paraspinal tissues:
Unremarkable.

Disc levels:

C2-3: Negative.

C3-4: Negative.

C4-5: Negative.

C5-6: Mild disc bulging, small left paracentral disc protrusion, and
mild uncovertebral spurring without significant stenosis.

C6-7: Mild disc bulging and uncovertebral spurring without
significant stenosis.

C7-T1: Tiny central disc protrusion without stenosis.
IMPRESSION: Mild cervical spondylosis without stenosis.

## 2023-04-04 ENCOUNTER — Other Ambulatory Visit: Payer: Self-pay

## 2023-04-10 DIAGNOSIS — F411 Generalized anxiety disorder: Secondary | ICD-10-CM | POA: Diagnosis not present

## 2023-04-14 ENCOUNTER — Other Ambulatory Visit: Payer: Self-pay

## 2023-04-19 DIAGNOSIS — F411 Generalized anxiety disorder: Secondary | ICD-10-CM | POA: Diagnosis not present

## 2023-04-25 DIAGNOSIS — F411 Generalized anxiety disorder: Secondary | ICD-10-CM | POA: Diagnosis not present

## 2023-04-29 DIAGNOSIS — F411 Generalized anxiety disorder: Secondary | ICD-10-CM | POA: Diagnosis not present

## 2023-05-02 DIAGNOSIS — F332 Major depressive disorder, recurrent severe without psychotic features: Secondary | ICD-10-CM | POA: Diagnosis not present

## 2023-05-02 DIAGNOSIS — F4312 Post-traumatic stress disorder, chronic: Secondary | ICD-10-CM | POA: Diagnosis not present

## 2023-05-02 DIAGNOSIS — F411 Generalized anxiety disorder: Secondary | ICD-10-CM | POA: Diagnosis not present

## 2023-05-02 DIAGNOSIS — F331 Major depressive disorder, recurrent, moderate: Secondary | ICD-10-CM | POA: Diagnosis not present

## 2023-05-03 DIAGNOSIS — F331 Major depressive disorder, recurrent, moderate: Secondary | ICD-10-CM | POA: Diagnosis not present

## 2023-05-05 ENCOUNTER — Other Ambulatory Visit: Payer: Self-pay

## 2023-05-05 MED ORDER — VRAYLAR 4.5 MG PO CAPS
4.5000 mg | ORAL_CAPSULE | Freq: Every day | ORAL | 2 refills | Status: DC
  Filled 2023-05-29 – 2023-07-18 (×2): qty 30, 30d supply, fill #0
  Filled 2023-09-12: qty 30, 30d supply, fill #1
  Filled 2023-10-10: qty 30, 30d supply, fill #2

## 2023-05-05 MED ORDER — CLONAZEPAM 1 MG PO TABS
1.0000 mg | ORAL_TABLET | Freq: Every day | ORAL | 2 refills | Status: AC | PRN
  Filled 2023-05-05: qty 30, 30d supply, fill #0
  Filled 2023-05-29 – 2023-06-02 (×2): qty 30, 30d supply, fill #1
  Filled 2023-10-10: qty 30, 30d supply, fill #2

## 2023-05-05 MED ORDER — BUSPIRONE HCL 15 MG PO TABS
15.0000 mg | ORAL_TABLET | Freq: Two times a day (BID) | ORAL | 2 refills | Status: DC
  Filled 2023-05-29: qty 60, 30d supply, fill #0
  Filled 2023-07-02: qty 60, 30d supply, fill #1
  Filled 2023-07-18 – 2023-07-29 (×2): qty 60, 30d supply, fill #2

## 2023-05-05 MED ORDER — LITHIUM CARBONATE 300 MG PO CAPS
300.0000 mg | ORAL_CAPSULE | Freq: Two times a day (BID) | ORAL | 2 refills | Status: DC
  Filled 2023-05-29: qty 60, 30d supply, fill #0
  Filled 2023-07-02: qty 60, 30d supply, fill #1
  Filled 2023-07-18 – 2023-07-29 (×2): qty 60, 30d supply, fill #2

## 2023-05-05 MED ORDER — BENZTROPINE MESYLATE 1 MG PO TABS
1.0000 mg | ORAL_TABLET | Freq: Three times a day (TID) | ORAL | 2 refills | Status: DC
  Filled 2023-05-14: qty 90, 30d supply, fill #0
  Filled 2023-05-29 – 2023-06-14 (×2): qty 90, 30d supply, fill #1
  Filled 2023-07-18: qty 90, 30d supply, fill #2

## 2023-05-05 MED ORDER — DULOXETINE HCL 60 MG PO CPEP
120.0000 mg | ORAL_CAPSULE | Freq: Every day | ORAL | 0 refills | Status: DC
  Filled 2023-05-05: qty 180, 90d supply, fill #0

## 2023-05-05 MED ORDER — GABAPENTIN 300 MG PO CAPS
300.0000 mg | ORAL_CAPSULE | Freq: Every day | ORAL | 2 refills | Status: DC
  Filled 2023-05-29: qty 120, 30d supply, fill #0
  Filled 2023-07-02: qty 120, 30d supply, fill #1
  Filled 2023-07-18 – 2023-07-29 (×2): qty 120, 30d supply, fill #2

## 2023-05-05 MED ORDER — TRAZODONE HCL 50 MG PO TABS
25.0000 mg | ORAL_TABLET | Freq: Every day | ORAL | 2 refills | Status: DC
  Filled 2023-05-05 – 2023-05-14 (×2): qty 60, 60d supply, fill #0
  Filled 2023-07-18: qty 60, 60d supply, fill #1
  Filled 2023-10-10: qty 60, 60d supply, fill #2

## 2023-05-08 DIAGNOSIS — F411 Generalized anxiety disorder: Secondary | ICD-10-CM | POA: Diagnosis not present

## 2023-05-14 ENCOUNTER — Other Ambulatory Visit: Payer: Self-pay

## 2023-05-14 DIAGNOSIS — F411 Generalized anxiety disorder: Secondary | ICD-10-CM | POA: Diagnosis not present

## 2023-05-14 MED FILL — Topiramate Tab 200 MG: ORAL | 30 days supply | Qty: 30 | Fill #5 | Status: AC

## 2023-05-14 MED FILL — Rimegepant Sulfate Tab Disint 75 MG: ORAL | 16 days supply | Qty: 8 | Fill #6 | Status: AC

## 2023-05-22 DIAGNOSIS — F411 Generalized anxiety disorder: Secondary | ICD-10-CM | POA: Diagnosis not present

## 2023-05-27 DIAGNOSIS — F411 Generalized anxiety disorder: Secondary | ICD-10-CM | POA: Diagnosis not present

## 2023-05-29 ENCOUNTER — Other Ambulatory Visit: Payer: Self-pay

## 2023-05-29 ENCOUNTER — Other Ambulatory Visit: Payer: Self-pay | Admitting: *Deleted

## 2023-05-29 MED FILL — Rimegepant Sulfate Tab Disint 75 MG: ORAL | 16 days supply | Qty: 8 | Fill #7 | Status: AC

## 2023-05-29 MED FILL — Topiramate Tab 200 MG: ORAL | 30 days supply | Qty: 30 | Fill #6 | Status: CN

## 2023-06-02 ENCOUNTER — Other Ambulatory Visit: Payer: Self-pay

## 2023-06-03 ENCOUNTER — Other Ambulatory Visit: Payer: Self-pay

## 2023-06-04 ENCOUNTER — Other Ambulatory Visit: Payer: Self-pay

## 2023-06-05 DIAGNOSIS — F411 Generalized anxiety disorder: Secondary | ICD-10-CM | POA: Diagnosis not present

## 2023-06-06 ENCOUNTER — Other Ambulatory Visit: Payer: Self-pay

## 2023-06-09 ENCOUNTER — Other Ambulatory Visit: Payer: Self-pay

## 2023-06-10 ENCOUNTER — Other Ambulatory Visit: Payer: Self-pay

## 2023-06-10 DIAGNOSIS — F321 Major depressive disorder, single episode, moderate: Secondary | ICD-10-CM | POA: Diagnosis not present

## 2023-06-11 ENCOUNTER — Other Ambulatory Visit: Payer: Self-pay

## 2023-06-12 ENCOUNTER — Other Ambulatory Visit: Payer: Self-pay

## 2023-06-12 ENCOUNTER — Emergency Department
Admission: EM | Admit: 2023-06-12 | Discharge: 2023-06-12 | Disposition: A | Discharge status: Home / Self Care | Payer: Worker's Compensation | Attending: Emergency Medicine | Admitting: Emergency Medicine

## 2023-06-12 ENCOUNTER — Emergency Department: Payer: Worker's Compensation

## 2023-06-12 DIAGNOSIS — Y990 Civilian activity done for income or pay: Secondary | ICD-10-CM | POA: Diagnosis not present

## 2023-06-12 DIAGNOSIS — S0990XA Unspecified injury of head, initial encounter: Secondary | ICD-10-CM | POA: Insufficient documentation

## 2023-06-12 DIAGNOSIS — S161XXA Strain of muscle, fascia and tendon at neck level, initial encounter: Secondary | ICD-10-CM | POA: Diagnosis not present

## 2023-06-12 DIAGNOSIS — W01198A Fall on same level from slipping, tripping and stumbling with subsequent striking against other object, initial encounter: Secondary | ICD-10-CM | POA: Diagnosis not present

## 2023-06-12 MED ORDER — IBUPROFEN 400 MG PO TABS
400.0000 mg | ORAL_TABLET | Freq: Once | ORAL | Status: AC | PRN
  Administered 2023-06-12: 400 mg via ORAL
  Filled 2023-06-12: qty 1

## 2023-06-12 MED ORDER — LIDOCAINE 5 % EX PTCH
1.0000 | MEDICATED_PATCH | Freq: Once | CUTANEOUS | Status: DC
  Administered 2023-06-12: 1 via TRANSDERMAL
  Filled 2023-06-12: qty 1

## 2023-06-12 MED ORDER — CYCLOBENZAPRINE HCL 5 MG PO TABS
5.0000 mg | ORAL_TABLET | Freq: Three times a day (TID) | ORAL | 0 refills | Status: AC | PRN
  Filled 2023-06-12: qty 30, 10d supply, fill #0

## 2023-06-12 NOTE — ED Triage Notes (Addendum)
Reports neck and bilateral shoulder pain. 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 State it hurts to move her neck from side to side and up and down. States she hit her head on the floor.

## 2023-06-12 NOTE — Discharge Instructions (Signed)
You were seen in the emergency department after an injury at work.  You had a CT scan of your head and your neck that did not show any internal bleeding or broken bones.  You can use over-the-counter Lidoderm patches 4%, keep on for 12 hours and then exchanged for a new one.  Pain control:  Ibuprofen (motrin/aleve/advil) - You can take 3 tablets (600 mg) every 6 hours as needed for pain/fever.  Acetaminophen (tylenol) - You can take 2 extra strength tablets (1000 mg) every 6 hours as needed for pain/fever.  You can alternate these medications or take them together.  Make sure you eat food/drink water when taking these medications.  Cyclbenzoprine (Flexeril) - You can take  (5 mg) every 8 hours as needed for muscle strain/spasm.  Do not drive or work on this medication.  This medication can cause you to feel tired.  Thank you for choosing [place] for your health care, it was my pleasure to care for you today!  Corena Herter, MD

## 2023-06-12 NOTE — ED Provider Triage Note (Signed)
 Emergency Medicine Provider Triage Evaluation Note  Kelly [name] , a 48 y.o. female  was evaluated in triage.  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  She denies loss of consciousness.Marland Kitchen  Physical Exam  BP 108/78   Pulse (!) 110   Temp 98 F (36.7 C)   Resp 15   LMP 06/05/2022 Comment: negative test 06/18/2022  SpO2 100%  Gen:   Awake, no distress   Resp:  Normal effort  MSK:   Moves extremities without difficulty  Other:    Medical Decision Making  Medically screening exam initiated at 2:15 PM.  Appropriate orders placed.  Kelly [name] was informed that the remainder of the evaluation will be completed by another provider, this initial triage assessment does not replace that evaluation, and the importance of remaining in the ED until their evaluation is complete.    Chinita Pester, FNP 06/13/23 1529

## 2023-06-12 NOTE — ED Provider Notes (Signed)
[facility] Provider Note    Event Date/Time   First MD Initiated Contact with Patient 06/12/23 1514     (approximate)   History   Neck Injury and Shoulder Pain   HPI  Kelly [name] is a 48 y.o. female no significant past medical history who presents to the emergency department following injury.  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  States that she fell backwards hitting her head and her neck.  Complaining of pain to the back of her neck.  Denies any pain to her lower back.  Ambulatory since that time.  Denies any significant weakness.  No change in vision, nausea or vomiting.  Not on anticoagulation.     Physical Exam   Triage Vital Signs: ED Triage Vitals  Encounter Vitals Group     BP 06/12/23 1413 108/78     Systolic BP Percentile --      Diastolic BP Percentile --      Pulse Rate 06/12/23 1413 (!) 110     Resp 06/12/23 1413 15     Temp 06/12/23 1413 98 F (36.7 C)     Temp src --      SpO2 06/12/23 1413 100 %     Weight 06/12/23 1505 253 lb 15.5 oz (115.2 kg)     Height 06/12/23 1505 5\' 7"  (1.702 m)     Head Circumference --      Peak Flow --      Pain Score 06/12/23 1413 8     Pain Loc --      Pain Education --      Exclude from Growth Chart --     Most recent vital signs: Vitals:   06/12/23 1413  BP: 108/78  Pulse: (!) 110  Resp: 15  Temp: 98 F (36.7 C)  SpO2: 100%    Physical Exam Constitutional:      Appearance: She is well-developed.  HENT:     Head: Atraumatic.  Eyes:     Conjunctiva/sclera: Conjunctivae normal.  Neck:     Comments: No midline cervical spine tenderness to palpation, bilateral paraspinal muscle tenderness to palpation Cardiovascular:     Rate and Rhythm: Regular rhythm.  Pulmonary:     Effort: No respiratory distress.  Abdominal:     General: There is no distension.  Musculoskeletal:        General: Normal range of motion.      Cervical back: Normal range of motion. Tenderness present.     Right lower leg: No edema.     Left lower leg: No edema.  Skin:    General: Skin is warm.     Capillary Refill: Capillary refill takes less than 2 seconds.     Comments: Good grip strength bilaterally.  Sensation intact bilateral upper extremities.  Neurological:     Mental Status: She is alert. Mental status is at baseline.      IMPRESSION / MDM / ASSESSMENT AND PLAN / ED COURSE  I reviewed the triage vital signs and the nursing notes.  Differential diagnosis including intracranial hemorrhage, cervical fracture, cervical strain, contusion   RADIOLOGY I independently reviewed imaging, my interpretation of imaging: CT scan of the head without signs of intracranial hemorrhage  CT scan of the cervical spine and head was read as no acute findings   Labs (all labs ordered are listed, but only abnormal results are displayed) Labs interpreted as -  Labs Reviewed - No data to display    Patient was given Motrin for pain control.  Most likely with cervical strain.  No concern for basilar skull fracture.  No other obvious traumatic injury.  Ambulatory.  Discussed Lidoderm patches, Motrin, Tylenol and muscle relaxer.  Given return precautions for any ongoing or worsening symptoms.  Discussed follow-up with primary care provider.   PROCEDURES:  Critical Care performed: No  Procedures  Patient's presentation is most consistent with acute complicated illness / injury requiring diagnostic workup.   MEDICATIONS ORDERED IN ED: Medications  ibuprofen (ADVIL) tablet 400 mg (400 mg Oral Given 06/12/23 1420)    FINAL CLINICAL IMPRESSION(S) / ED DIAGNOSES   Final diagnoses:  Acute strain of neck muscle, initial encounter  Injury of head, initial encounter     Rx / DC Orders   ED Discharge Orders          Ordered    cyclobenzaprine (FLEXERIL) 5 MG tablet  3 times daily PRN        06/12/23 1525              Note:  This document was prepared using Dragon voice recognition software and may include unintentional dictation errors.   Corena Herter, MD 06/12/23 [phone]

## 2023-06-13 ENCOUNTER — Other Ambulatory Visit: Payer: Self-pay

## 2023-06-14 MED FILL — Topiramate Tab 200 MG: ORAL | 30 days supply | Qty: 30 | Fill #6 | Status: AC

## 2023-06-16 ENCOUNTER — Other Ambulatory Visit: Payer: Self-pay

## 2023-06-16 DIAGNOSIS — R9431 Abnormal electrocardiogram [ECG] [EKG]: Secondary | ICD-10-CM | POA: Diagnosis not present

## 2023-06-16 DIAGNOSIS — M25522 Pain in left elbow: Secondary | ICD-10-CM | POA: Diagnosis not present

## 2023-06-16 DIAGNOSIS — F0781 Postconcussional syndrome: Secondary | ICD-10-CM | POA: Diagnosis not present

## 2023-06-16 DIAGNOSIS — R519 Headache, unspecified: Secondary | ICD-10-CM | POA: Diagnosis not present

## 2023-06-16 DIAGNOSIS — M25521 Pain in right elbow: Secondary | ICD-10-CM | POA: Diagnosis not present

## 2023-06-16 DIAGNOSIS — F419 Anxiety disorder, unspecified: Secondary | ICD-10-CM | POA: Diagnosis not present

## 2023-06-16 DIAGNOSIS — Z90710 Acquired absence of both cervix and uterus: Secondary | ICD-10-CM | POA: Diagnosis not present

## 2023-06-16 DIAGNOSIS — S0990XA Unspecified injury of head, initial encounter: Secondary | ICD-10-CM | POA: Diagnosis not present

## 2023-06-16 DIAGNOSIS — Z7982 Long term (current) use of aspirin: Secondary | ICD-10-CM | POA: Diagnosis not present

## 2023-06-16 DIAGNOSIS — S060X0S Concussion without loss of consciousness, sequela: Secondary | ICD-10-CM | POA: Diagnosis not present

## 2023-06-16 DIAGNOSIS — F32A Depression, unspecified: Secondary | ICD-10-CM | POA: Diagnosis not present

## 2023-06-16 DIAGNOSIS — E785 Hyperlipidemia, unspecified: Secondary | ICD-10-CM | POA: Diagnosis not present

## 2023-06-16 DIAGNOSIS — R4182 Altered mental status, unspecified: Secondary | ICD-10-CM | POA: Diagnosis not present

## 2023-06-19 DIAGNOSIS — F321 Major depressive disorder, single episode, moderate: Secondary | ICD-10-CM | POA: Diagnosis not present

## 2023-06-28 DIAGNOSIS — F321 Major depressive disorder, single episode, moderate: Secondary | ICD-10-CM | POA: Diagnosis not present

## 2023-07-03 DIAGNOSIS — F321 Major depressive disorder, single episode, moderate: Secondary | ICD-10-CM | POA: Diagnosis not present

## 2023-07-07 DIAGNOSIS — F411 Generalized anxiety disorder: Secondary | ICD-10-CM | POA: Diagnosis not present

## 2023-07-17 DIAGNOSIS — F411 Generalized anxiety disorder: Secondary | ICD-10-CM | POA: Diagnosis not present

## 2023-07-18 ENCOUNTER — Other Ambulatory Visit: Payer: Self-pay

## 2023-07-18 MED ORDER — GABAPENTIN 300 MG PO CAPS
300.0000 mg | ORAL_CAPSULE | Freq: Every day | ORAL | 11 refills | Status: DC
  Filled 2023-07-18 – 2023-10-10 (×3): qty 30, 30d supply, fill #0

## 2023-07-18 MED FILL — Rimegepant Sulfate Tab Disint 75 MG: ORAL | 16 days supply | Qty: 8 | Fill #8 | Status: AC

## 2023-07-18 MED FILL — Topiramate Tab 200 MG: ORAL | 30 days supply | Qty: 30 | Fill #7 | Status: AC

## 2023-07-21 ENCOUNTER — Other Ambulatory Visit: Payer: Self-pay

## 2023-07-21 DIAGNOSIS — F411 Generalized anxiety disorder: Secondary | ICD-10-CM | POA: Diagnosis not present

## 2023-07-22 ENCOUNTER — Other Ambulatory Visit: Payer: Self-pay

## 2023-07-23 ENCOUNTER — Other Ambulatory Visit: Payer: Self-pay

## 2023-07-24 ENCOUNTER — Other Ambulatory Visit: Payer: Self-pay

## 2023-07-24 ENCOUNTER — Other Ambulatory Visit: Payer: Self-pay | Admitting: Physician Assistant

## 2023-07-25 ENCOUNTER — Other Ambulatory Visit: Payer: Self-pay

## 2023-07-28 ENCOUNTER — Other Ambulatory Visit: Payer: Self-pay

## 2023-07-29 ENCOUNTER — Other Ambulatory Visit: Payer: Self-pay

## 2023-07-30 ENCOUNTER — Other Ambulatory Visit: Payer: Self-pay

## 2023-07-31 ENCOUNTER — Other Ambulatory Visit: Payer: Self-pay

## 2023-07-31 DIAGNOSIS — F411 Generalized anxiety disorder: Secondary | ICD-10-CM | POA: Diagnosis not present

## 2023-08-01 ENCOUNTER — Other Ambulatory Visit: Payer: Self-pay

## 2023-08-01 DIAGNOSIS — F331 Major depressive disorder, recurrent, moderate: Secondary | ICD-10-CM | POA: Diagnosis not present

## 2023-08-01 DIAGNOSIS — F4312 Post-traumatic stress disorder, chronic: Secondary | ICD-10-CM | POA: Diagnosis not present

## 2023-08-01 DIAGNOSIS — F332 Major depressive disorder, recurrent severe without psychotic features: Secondary | ICD-10-CM | POA: Diagnosis not present

## 2023-08-01 DIAGNOSIS — F411 Generalized anxiety disorder: Secondary | ICD-10-CM | POA: Diagnosis not present

## 2023-08-01 MED ORDER — VRAYLAR 4.5 MG PO CAPS
4.5000 mg | ORAL_CAPSULE | Freq: Every day | ORAL | 2 refills | Status: AC
  Filled 2023-10-10: qty 30, 30d supply, fill #0

## 2023-08-01 MED ORDER — DULOXETINE HCL 60 MG PO CPEP
120.0000 mg | ORAL_CAPSULE | Freq: Every day | ORAL | 0 refills | Status: DC
  Filled 2023-08-01: qty 180, 90d supply, fill #0

## 2023-08-01 MED ORDER — BENZTROPINE MESYLATE 1 MG PO TABS
ORAL_TABLET | ORAL | 2 refills | Status: DC
  Filled 2023-08-01 – 2023-09-12 (×2): qty 120, 30d supply, fill #0
  Filled 2023-10-10: qty 120, 30d supply, fill #1

## 2023-08-01 MED ORDER — TRAZODONE HCL 50 MG PO TABS
25.0000 mg | ORAL_TABLET | Freq: Every evening | ORAL | 2 refills | Status: DC
  Filled 2023-08-01: qty 60, 60d supply, fill #0
  Filled 2023-09-12: qty 60, 30d supply, fill #0
  Filled 2023-10-10: qty 60, 30d supply, fill #1

## 2023-08-01 MED ORDER — GABAPENTIN 300 MG PO CAPS
300.0000 mg | ORAL_CAPSULE | Freq: Every evening | ORAL | 2 refills | Status: DC
  Filled 2023-08-01 – 2023-09-12 (×2): qty 120, 30d supply, fill #0
  Filled 2023-10-10: qty 120, 30d supply, fill #1

## 2023-08-01 MED ORDER — BUSPIRONE HCL 15 MG PO TABS
15.0000 mg | ORAL_TABLET | Freq: Two times a day (BID) | ORAL | 2 refills | Status: AC
  Filled 2023-08-01 – 2023-09-12 (×2): qty 60, 30d supply, fill #0
  Filled 2023-10-10: qty 60, 30d supply, fill #1

## 2023-08-01 MED ORDER — LITHIUM CARBONATE 300 MG PO CAPS
300.0000 mg | ORAL_CAPSULE | Freq: Two times a day (BID) | ORAL | 2 refills | Status: DC
  Filled 2023-08-01 – 2023-09-12 (×2): qty 60, 30d supply, fill #0
  Filled 2023-10-10 – 2023-11-05 (×3): qty 60, 30d supply, fill #1

## 2023-08-04 DIAGNOSIS — F411 Generalized anxiety disorder: Secondary | ICD-10-CM | POA: Diagnosis not present

## 2023-08-06 DIAGNOSIS — G4733 Obstructive sleep apnea (adult) (pediatric): Secondary | ICD-10-CM | POA: Diagnosis not present

## 2023-08-06 DIAGNOSIS — R519 Headache, unspecified: Secondary | ICD-10-CM | POA: Diagnosis not present

## 2023-08-06 DIAGNOSIS — E785 Hyperlipidemia, unspecified: Secondary | ICD-10-CM | POA: Diagnosis not present

## 2023-08-06 DIAGNOSIS — Z79899 Other long term (current) drug therapy: Secondary | ICD-10-CM | POA: Diagnosis not present

## 2023-08-06 DIAGNOSIS — K219 Gastro-esophageal reflux disease without esophagitis: Secondary | ICD-10-CM | POA: Diagnosis not present

## 2023-08-06 DIAGNOSIS — R441 Visual hallucinations: Secondary | ICD-10-CM | POA: Diagnosis not present

## 2023-08-06 DIAGNOSIS — F410 Panic disorder [episodic paroxysmal anxiety] without agoraphobia: Secondary | ICD-10-CM | POA: Diagnosis not present

## 2023-08-06 DIAGNOSIS — Z7982 Long term (current) use of aspirin: Secondary | ICD-10-CM | POA: Diagnosis not present

## 2023-08-06 DIAGNOSIS — R443 Hallucinations, unspecified: Secondary | ICD-10-CM | POA: Diagnosis not present

## 2023-08-06 DIAGNOSIS — F4310 Post-traumatic stress disorder, unspecified: Secondary | ICD-10-CM | POA: Diagnosis not present

## 2023-08-13 DIAGNOSIS — F411 Generalized anxiety disorder: Secondary | ICD-10-CM | POA: Diagnosis not present

## 2023-08-19 ENCOUNTER — Other Ambulatory Visit: Payer: Self-pay

## 2023-08-19 DIAGNOSIS — F4312 Post-traumatic stress disorder, chronic: Secondary | ICD-10-CM | POA: Diagnosis not present

## 2023-08-19 DIAGNOSIS — F332 Major depressive disorder, recurrent severe without psychotic features: Secondary | ICD-10-CM | POA: Diagnosis not present

## 2023-08-19 DIAGNOSIS — F411 Generalized anxiety disorder: Secondary | ICD-10-CM | POA: Diagnosis not present

## 2023-08-19 MED ORDER — PROPRANOLOL HCL 20 MG PO TABS
20.0000 mg | ORAL_TABLET | Freq: Two times a day (BID) | ORAL | 1 refills | Status: DC
  Filled 2023-08-19 – 2023-09-12 (×2): qty 60, 30d supply, fill #0
  Filled 2023-10-10: qty 60, 30d supply, fill #1

## 2023-08-24 DIAGNOSIS — F411 Generalized anxiety disorder: Secondary | ICD-10-CM | POA: Diagnosis not present

## 2023-09-01 ENCOUNTER — Other Ambulatory Visit: Payer: Self-pay

## 2023-09-02 DIAGNOSIS — F411 Generalized anxiety disorder: Secondary | ICD-10-CM | POA: Diagnosis not present

## 2023-09-02 DIAGNOSIS — F331 Major depressive disorder, recurrent, moderate: Secondary | ICD-10-CM | POA: Diagnosis not present

## 2023-09-04 ENCOUNTER — Other Ambulatory Visit: Payer: Self-pay

## 2023-09-12 ENCOUNTER — Other Ambulatory Visit: Payer: Self-pay

## 2023-09-12 DIAGNOSIS — F411 Generalized anxiety disorder: Secondary | ICD-10-CM | POA: Diagnosis not present

## 2023-09-12 DIAGNOSIS — F331 Major depressive disorder, recurrent, moderate: Secondary | ICD-10-CM | POA: Diagnosis not present

## 2023-09-12 MED FILL — Topiramate Tab 200 MG: ORAL | 30 days supply | Qty: 30 | Fill #8 | Status: AC

## 2023-09-16 DIAGNOSIS — F411 Generalized anxiety disorder: Secondary | ICD-10-CM | POA: Diagnosis not present

## 2023-09-22 DIAGNOSIS — F411 Generalized anxiety disorder: Secondary | ICD-10-CM | POA: Diagnosis not present

## 2023-10-02 ENCOUNTER — Ambulatory Visit: Admitting: Obstetrics and Gynecology

## 2023-10-02 ENCOUNTER — Other Ambulatory Visit: Payer: Self-pay

## 2023-10-02 ENCOUNTER — Other Ambulatory Visit (HOSPITAL_COMMUNITY)
Admission: RE | Admit: 2023-10-02 | Discharge: 2023-10-02 | Disposition: A | Discharge status: Home / Self Care | Source: Ambulatory Visit | Attending: Obstetrics and Gynecology | Admitting: Obstetrics and Gynecology

## 2023-10-02 ENCOUNTER — Encounter: Payer: Self-pay | Admitting: Obstetrics and Gynecology

## 2023-10-02 ENCOUNTER — Encounter: Payer: Self-pay | Admitting: Gastroenterology

## 2023-10-02 VITALS — BP 85/58 | HR 49 | Wt 240.0 lb

## 2023-10-02 DIAGNOSIS — Z113 Encounter for screening for infections with a predominantly sexual mode of transmission: Secondary | ICD-10-CM | POA: Diagnosis not present

## 2023-10-02 DIAGNOSIS — Z01419 Encounter for gynecological examination (general) (routine) without abnormal findings: Secondary | ICD-10-CM | POA: Insufficient documentation

## 2023-10-02 DIAGNOSIS — R32 Unspecified urinary incontinence: Secondary | ICD-10-CM | POA: Diagnosis not present

## 2023-10-02 MED ORDER — NYSTATIN-TRIAMCINOLONE 100000-0.1 UNIT/GM-% EX OINT
1.0000 | TOPICAL_OINTMENT | Freq: Two times a day (BID) | CUTANEOUS | 0 refills | Status: DC
  Filled 2023-10-02: qty 30, 15d supply, fill #0

## 2023-10-02 NOTE — Progress Notes (Signed)
 Patient presents for Annual.  Transvaginal hysterectomy with bilateral salpingectomy 05/2022  Last Annual 02/04/22. Mammogram:11/2021 WNL Would like in [facility] Area STD Screening: Yes.  CC: Vaginal itching on the outside of vaginal area.x 1wk now.

## 2023-10-02 NOTE — Progress Notes (Signed)
 Subjective:     Kelly [name] is a 48 y.o. female P2 s/p hysterectomy due to AUB and BMI 37 who is here for a comprehensive physical exam. The patient reports vulva pruritus for the past week. She denies any episode of vaginal bleeding. Patient is sexually active without dyspareunia. She denies pelvic pain or abnormal discharge. Patient reports urinary incontinence since her hysterectomy. Incontinence happens at rest without always feeling the urge to void. Patient is without any other complaints. Patient with history of LGSIL since 2022  Past Medical History:  Diagnosis Date   Anxiety    Depression    GERD (gastroesophageal reflux disease)    Headache    Hyperlipemia    Myofascial pain syndrome    Past Surgical History:  Procedure Laterality Date   CESAREAN SECTION     CHOLECYSTECTOMY     COLPOSCOPY W/ BIOPSY / CURETTAGE  02/28/2022   TUBAL LIGATION     VAGINAL HYSTERECTOMY Bilateral 06/18/2022   Procedure: HYSTERECTOMY VAGINAL WITH BILATERAL SALPINGECTOMY;  Surgeon: Granville Layer, MD;  Location: [facility] OR;  Service: Gynecology;  Laterality: Bilateral;   Family History  Problem Relation Age of Onset   Breast cancer Maternal Aunt     Social History   Socioeconomic History   Marital status: Single    Spouse name: Not on file   Number of children: Not on file   Years of education: Not on file   Highest education level: Not on file  Occupational History   Not on file  Tobacco Use   Smoking status: Never   Smokeless tobacco: Never  Vaping Use   Vaping status: Never Used  Substance and Sexual Activity   Alcohol  use: No   Drug use: No   Sexual activity: Yes    Partners: Male    Birth control/protection: Surgical  Other Topics Concern   Not on file  Social History Narrative   Not on file   Social Drivers of Health   Financial Resource Strain: Not on file  Food Insecurity: No Food Insecurity (06/18/2022)   Hunger Vital Sign    Worried About Running Out of Food in the  Last Year: Never true    Ran Out of Food in the Last Year: Never true  Transportation Needs: No Transportation Needs (06/18/2022)   PRAPARE - Transportation    Lack of Transportation (Medical): No    Lack of Transportation (Non-Medical): No  Physical Activity: Not on file  Stress: Not on file  Social Connections: Not on file  Intimate Partner Violence: Not At Risk (06/18/2022)   Humiliation, Afraid, Rape, and Kick questionnaire    Fear of Current or Ex-Partner: No    Emotionally Abused: No    Physically Abused: No    Sexually Abused: No   Health Maintenance  Topic Date Due   COVID-19 Vaccine (1) Never done   Hepatitis C Screening  Never done   DTaP/Tdap/Td (1 - Tdap) Never done   HPV VACCINES (3 - 3-dose series) 08/21/2021   Colonoscopy  Never done   Cervical Cancer Screening (Pap smear)  02/05/2023   INFLUENZA VACCINE  11/21/2023   HIV Screening  Completed   Meningococcal B Vaccine  Aged Out       Review of Systems Pertinent items noted in HPI and remainder of comprehensive ROS otherwise negative.   Objective:  Blood pressure (!) 85/58, pulse (!) 49, weight 240 lb (108.9 kg), last menstrual period 06/05/2022.   GENERAL: Well-developed, well-nourished female in no  acute distress.  HEENT: Normocephalic, atraumatic. Sclerae anicteric.  NECK: Supple. Normal thyroid .  LUNGS: Clear to auscultation bilaterally.  HEART: Regular rate and rhythm. BREASTS: Symmetric in size. No palpable masses or lymphadenopathy, skin changes, or nipple drainage. ABDOMEN: Soft, nontender, nondistended. No organomegaly. PELVIC: Normal external female genitalia with skin erythema. Leakage of urine noted while positioning herself for her pap smear. Vagina is pink and rugated.  Normal discharge. Vaginal vault intact. No adnexal mass or tenderness. Chaperone present during the pelvic exam EXTREMITIES: No cyanosis, clubbing, or edema, 2+ distal pulses.     Assessment:    Healthy female exam.       Plan:    Pap smear collected due to recent history of persistent CIN 1 Screening mammogram ordered Colonoscopy ordered Patient will be contacted with abnormal results Rx mycolog provided Patient referred to Urogynecology for incontinence evaluation See After Visit Summary for Counseling Recommendations

## 2023-10-03 LAB — RPR+HBSAG+HCVAB+...
HIV Screen 4th Generation wRfx: NONREACTIVE
Hep C Virus Ab: NONREACTIVE
Hepatitis B Surface Ag: NEGATIVE
RPR Ser Ql: NONREACTIVE

## 2023-10-04 DIAGNOSIS — F331 Major depressive disorder, recurrent, moderate: Secondary | ICD-10-CM | POA: Diagnosis not present

## 2023-10-07 ENCOUNTER — Ambulatory Visit

## 2023-10-07 ENCOUNTER — Telehealth: Payer: Self-pay | Admitting: *Deleted

## 2023-10-07 LAB — CYTOLOGY - PAP
Chlamydia: NEGATIVE
Comment: NEGATIVE
Comment: NEGATIVE
Comment: NORMAL
Diagnosis: NEGATIVE
Neisseria Gonorrhea: NEGATIVE
Trichomonas: NEGATIVE

## 2023-10-07 NOTE — Telephone Encounter (Signed)
 Attempt to reach pt for pre-visit. LM with call back #.  Will attempt to reach again in 5 min due to no other # listed in profile  Second attempt to reach pt for pre-vist unsuccessful. LM with facility # for pt to call back. Instructed pt to call # given by end of the day and reschedule the pre-visit  with RN or the scheduled procedure will be canceled.

## 2023-10-07 NOTE — Progress Notes (Unsigned)
 Kelly [name]

## 2023-10-09 DIAGNOSIS — F331 Major depressive disorder, recurrent, moderate: Secondary | ICD-10-CM | POA: Diagnosis not present

## 2023-10-10 ENCOUNTER — Other Ambulatory Visit: Payer: Self-pay

## 2023-10-10 ENCOUNTER — Ambulatory Visit

## 2023-10-10 VITALS — Ht 67.0 in | Wt 240.0 lb

## 2023-10-10 DIAGNOSIS — F331 Major depressive disorder, recurrent, moderate: Secondary | ICD-10-CM | POA: Diagnosis not present

## 2023-10-10 DIAGNOSIS — Z1211 Encounter for screening for malignant neoplasm of colon: Secondary | ICD-10-CM

## 2023-10-10 MED ORDER — NA SULFATE-K SULFATE-MG SULF 17.5-3.13-1.6 GM/177ML PO SOLN
1.0000 | Freq: Once | ORAL | 0 refills | Status: AC
  Filled 2023-10-10: qty 354, 1d supply, fill #0

## 2023-10-10 MED FILL — Rimegepant Sulfate Tab Disint 75 MG: ORAL | 16 days supply | Qty: 8 | Fill #9 | Status: CN

## 2023-10-10 MED FILL — Topiramate Tab 200 MG: ORAL | 30 days supply | Qty: 30 | Fill #9 | Status: CN

## 2023-10-10 NOTE — Progress Notes (Signed)
 No egg or soy allergy known to patient  No issues known to pt with past sedation with any surgeries or procedures Patient denies ever being told they had issues or difficulty with intubation  No FH of Malignant Hyperthermia Pt is not on diet pills Pt is not on  home 02  Pt is not on blood thinners  Pt has intermittent issues with constipation, takes Miralax  as needed  No A fib or A flutter Have any cardiac testing pending--No Pt can ambulate  Pt denies use of chewing tobacco Discussed diabetic I weight loss medication holds Discussed NSAID holds Checked BMI Pt instructed to use Single[employer].com or GoodRx for a price reduction on prep  Pre visit completed

## 2023-10-13 ENCOUNTER — Encounter: Payer: Self-pay | Admitting: Gastroenterology

## 2023-10-20 ENCOUNTER — Other Ambulatory Visit: Payer: Self-pay

## 2023-10-21 ENCOUNTER — Other Ambulatory Visit: Payer: Self-pay

## 2023-10-21 ENCOUNTER — Encounter: Admitting: Gastroenterology

## 2023-10-21 ENCOUNTER — Telehealth: Payer: Self-pay | Admitting: Gastroenterology

## 2023-10-21 NOTE — Telephone Encounter (Signed)
 Good Afternoon Dr. Nandigam,  I called this patient at 12:45 pm  to see if she was coming for her procedure.  I could not leave a message her voicemail was full.  I will NO SHOW her.  Jolynn Pack

## 2023-11-05 ENCOUNTER — Other Ambulatory Visit: Payer: Self-pay

## 2023-11-28 ENCOUNTER — Encounter: Payer: Self-pay | Admitting: Physician Assistant

## 2023-11-28 ENCOUNTER — Ambulatory Visit: Admitting: Physician Assistant

## 2023-11-28 VITALS — BP 114/78 | HR 128 | Wt 226.0 lb

## 2023-11-28 DIAGNOSIS — S0990XA Unspecified injury of head, initial encounter: Secondary | ICD-10-CM

## 2023-11-28 DIAGNOSIS — S199XXA Unspecified injury of neck, initial encounter: Secondary | ICD-10-CM | POA: Diagnosis not present

## 2023-11-28 DIAGNOSIS — G43009 Migraine without aura, not intractable, without status migrainosus: Secondary | ICD-10-CM | POA: Diagnosis not present

## 2023-11-28 DIAGNOSIS — R000 Tachycardia, unspecified: Secondary | ICD-10-CM | POA: Diagnosis not present

## 2023-11-28 MED ORDER — NAPROXEN 500 MG PO TABS: 500.0000 mg | ORAL_TABLET | Freq: Two times a day (BID) | ORAL | 2 refills | Status: AC

## 2023-11-28 MED ORDER — METOPROLOL TARTRATE 25 MG PO TABS: 25.0000 mg | ORAL_TABLET | Freq: Every day | ORAL | 3 refills | Status: DC

## 2023-11-28 MED ORDER — GABAPENTIN 300 MG PO CAPS: 300.0000 mg | ORAL_CAPSULE | ORAL | 3 refills | Status: DC

## 2023-11-28 MED ORDER — CYCLOBENZAPRINE HCL 10 MG PO TABS: 10.0000 mg | ORAL_TABLET | Freq: Three times a day (TID) | ORAL | 3 refills | Status: AC | PRN

## 2023-11-28 MED ORDER — NURTEC 75 MG PO TBDP: 75.0000 mg | ORAL_TABLET | ORAL | 11 refills | Status: AC

## 2023-11-28 MED ORDER — TOPIRAMATE 200 MG PO TABS: 200.0000 mg | ORAL_TABLET | Freq: Every day | ORAL | 11 refills | Status: AC

## 2023-11-28 NOTE — Progress Notes (Signed)
 Follow up Headache  History:  Kelly [name] is a 48 y.o. [id number] who presents to clinic today for eval of headache.  She got a concussion at her job in mid-May.  She has seen the concussion clinic.  She has been separated from employment.  She is very stressed, depressed and anxious related to work/home life and just who she is.   There is a new kind of headache.  It is at the top of her head and begins dull.  It gets to be more mid and lingers up to a full day.  She awakens the next morning and it is better.    She has times where her vision seems to slide from one side to the other.  She closes her eyes and it returns to normal.  Sje reports muscle tension in her neck, arms and shoulders.   She started getting these following her injury in may.   She has tried rest, shower, changing her diet/losing weight, tylenol , ibuprofen , nurtec, clonidine and all of these give no response toward the head pain.   She has lost 30# additional weight since I last saw her due to dietary changes - and being too depressed to eat.   She is walking about 1/2 mile 3 times daily with her dog.  Number of days in the last 4 weeks with:  Severe headache: 2 Moderate headache: 7 Mild headache: 7  No headache: 8   Past Medical History:  Diagnosis Date   Anxiety    Depression    GERD (gastroesophageal reflux disease)    Headache    Hyperlipemia    Hypertension    Myofascial pain syndrome    Sleep apnea     Social History   Socioeconomic History   Marital status: Single    Spouse name: Not on file   Number of children: Not on file   Years of education: Not on file   Highest education level: Not on file  Occupational History   Not on file  Tobacco Use   Smoking status: Never   Smokeless tobacco: Never  Vaping Use   Vaping status: Never Used  Substance and Sexual Activity   Alcohol  use: No   Drug use: No   Sexual activity: Yes    Partners: Male    Birth control/protection: Surgical  Other Topics  Concern   Not on file  Social History Narrative   Not on file   Social Drivers of Health   Financial Resource Strain: Not on file  Food Insecurity: No Food Insecurity (06/18/2022)   Hunger Vital Sign    Worried About Running Out of Food in the Last Year: Never true    Ran Out of Food in the Last Year: Never true  Transportation Needs: No Transportation Needs (06/18/2022)   PRAPARE - Administrator, Civil Service (Medical): No    Lack of Transportation (Non-Medical): No  Physical Activity: Not on file  Stress: Not on file  Social Connections: Not on file  Intimate Partner Violence: Not At Risk (06/18/2022)   Humiliation, Afraid, Rape, and Kick questionnaire    Fear of Current or Ex-Partner: No    Emotionally Abused: No    Physically Abused: No    Sexually Abused: No    Family History  Problem Relation Age of Onset   Breast cancer Maternal Aunt    Colon cancer Neg Hx    Prostate cancer Neg Hx    Rectal cancer Neg Hx  Esophageal cancer Neg Hx     No Known Allergies  Current Outpatient Medications on File Prior to Visit  Medication Sig Dispense Refill   benztropine  (COGENTIN ) 1 MG tablet Take 2 tablets (2 mg total) by mouth every morning AND 1 tablet (1 mg total) 2 (two) times daily in the afternoon and in the evening. 120 tablet 2   busPIRone  (BUSPAR ) 15 MG tablet Take 1 tablet (15 mg total) by mouth 2 (two) times daily. 60 tablet 2   Cariprazine  HCl (VRAYLAR ) 4.5 MG CAPS Take 1 capsule (4.5 mg total) by mouth daily. 30 capsule 2   clonazePAM  (KLONOPIN ) 1 MG tablet Take 1 tablet (1 mg total) by mouth daily as needed. 30 tablet 2   gabapentin  (NEURONTIN ) 300 MG capsule Take 1-4 capsules (300-1,200 mg total) by mouth at bedtime. 120 capsule 2   lithium  carbonate 300 MG capsule Take 1 capsule (300 mg total) by mouth 2 (two) times daily. 60 capsule 2   Rimegepant Sulfate  (NURTEC) 75 MG TBDP Take 75 mg by mouth as needed. 8 tablet 11   topiramate  (TOPAMAX ) 200 MG  tablet Take 1 tablet (200 mg total) by mouth daily. 30 tablet 11   traZODone  (DESYREL ) 50 MG tablet Take 0.5-2 tablets (25-100 mg total) by mouth at bedtime. 60 tablet 2   No current facility-administered medications on file prior to visit.     Review of Systems:  All pertinent positive/negative included in HPI, all other review of systems are negative   Objective:  Physical Exam BP 114/78   Pulse (!) 128   Wt 226 lb (102.5 kg)   LMP 06/05/2022 Comment: negative test 06/18/2022  BMI 35.40 kg/m  CONSTITUTIONAL: Well-developed, well-nourished female in no acute distress.  EYES: EOM intact ENT: Normocephalic CARDIOVASCULAR: Regular rate  RESPIRATORY: Normal rate.   MUSCULOSKELETAL: Normal ROM SKIN: Warm, dry without erythema  NEUROLOGICAL: Alert, oriented, CN II-XII grossly intact, Appropriate balance PSYCH: Flat affect, Normal behavior   Assessment & Plan:  Assessment: 1. Headache due to injury of head and neck   2. Tachycardia   3. Migraine without aura and without status migrainosus, not intractable      Plan: Add 300mg  gabapentin  in the am plus additional 300mg  when headache arises.  Continue usual 3 tabs at night. Add naprosyn  - use twice a day for the first 2-3 days and then as needed for HA. Cyclobenzaprine  - 1/2 to 1 tab to treat muscle spasm/HA.  Sedation precautions.   Continue Nurtec for migraine.  Can use every other day for prevention if/when needed Continue Topamax  unchanged. Add metoprolol  25mg  daily to help prevent HA/anxiety and hopefully help with tachycardia too Work on creating and keeping regular schedule for sleep/eating/exercising.  Continue regular outside dog walks.  Work to add daily yoga.  Consider pilates or other activity to help all the things - but preserving comfort level with HA. Congratulated for excellent weight loss - which has helped sleep apnea!  Follow-up in 3 months or sooner PRN  53 minutes spent face to face with patient this  encounter.  Nance Gretta Darice FORBES, PA-C 11/28/2023 9:05 AM

## 2023-12-23 ENCOUNTER — Encounter: Payer: Self-pay | Admitting: *Deleted

## 2023-12-24 ENCOUNTER — Ambulatory Visit: Admitting: Obstetrics

## 2024-02-10 ENCOUNTER — Ambulatory Visit
Admission: EM | Admit: 2024-02-10 | Discharge: 2024-02-10 | Disposition: A | Discharge status: Home / Self Care | Attending: Physician Assistant | Admitting: Physician Assistant

## 2024-02-10 ENCOUNTER — Encounter: Payer: Self-pay | Admitting: Emergency Medicine

## 2024-02-10 DIAGNOSIS — H6121 Impacted cerumen, right ear: Secondary | ICD-10-CM | POA: Diagnosis not present

## 2024-02-10 NOTE — ED Triage Notes (Signed)
Pt presents with right ear fullness x 1 week

## 2024-02-10 NOTE — ED Provider Notes (Signed)
 [facility]    CSN: [id number] Arrival date & time: 02/10/24  1048      History   Chief Complaint Chief Complaint  Patient presents with   Ear Fullness    HPI Kelly [name] is a 48 y.o. female presenting for 1 week history of right ear fullness and reduced hearing. No pain, drainage, bleeding or recent URI symptoms.   HPI  Past Medical History:  Diagnosis Date   Anxiety    Depression    GERD (gastroesophageal reflux disease)    Headache    Hyperlipemia    Hypertension    Myofascial pain syndrome    Sleep apnea     Patient Active Problem List   Diagnosis Date Noted   Tachycardia 11/28/2023   Headache due to injury of head and neck 11/28/2023   Status post hysterectomy 06/18/2022   OSA (obstructive sleep apnea) 09/14/2021   Near syncope 03/09/2021   Migraine without aura and without status migrainosus, not intractable 02/05/2019   Medication overuse headache 02/05/2019    Past Surgical History:  Procedure Laterality Date   CESAREAN SECTION     CHOLECYSTECTOMY     COLPOSCOPY W/ BIOPSY / CURETTAGE  02/28/2022   TUBAL LIGATION     VAGINAL HYSTERECTOMY Bilateral 06/18/2022   Procedure: HYSTERECTOMY VAGINAL WITH BILATERAL SALPINGECTOMY;  Surgeon: Fredirick Glenys RAMAN, MD;  Location: [facility];  Service: Gynecology;  Laterality: Bilateral;    OB History     Gravida  3   Para  2   Term  2   Preterm      AB  1   Living  2      SAB  1   IAB      Ectopic      Multiple      Live Births  2            Home Medications    Prior to Admission medications   Medication Sig Start Date End Date Taking? Authorizing Provider  benztropine  (COGENTIN ) 1 MG tablet Take 2 tablets (2 mg total) by mouth every morning AND 1 tablet (1 mg total) 2 (two) times daily in the afternoon and in the evening. 08/01/23     busPIRone  (BUSPAR ) 15 MG tablet Take 1 tablet (15 mg total) by mouth 2 (two) times daily. 08/01/23     Cariprazine  HCl (VRAYLAR ) 4.5 MG CAPS  Take 1 capsule (4.5 mg total) by mouth daily. 08/01/23     clonazePAM  (KLONOPIN ) 1 MG tablet Take 1 tablet (1 mg total) by mouth daily as needed. 05/05/23     cyclobenzaprine  (FLEXERIL ) 10 MG tablet Take 1 tablet (10 mg total) by mouth every 8 (eight) hours as needed for muscle spasms (headache). 11/28/23   Nance Gaskins, Darice BRAVO, PA-C  gabapentin  (NEURONTIN ) 300 MG capsule Take 1-4 capsules (300-1,200 mg total) by mouth at bedtime. 08/01/23     gabapentin  (NEURONTIN ) 300 MG capsule Take 1 capsule (300 mg total) by mouth PRO. Use 1 Q AM, 3 at bedtime and 1 PRN 11/28/23   Nance Gaskins, Darice BRAVO, PA-C  lithium  carbonate 300 MG capsule Take 1 capsule (300 mg total) by mouth 2 (two) times daily. 08/01/23     metoprolol  tartrate (LOPRESSOR ) 25 MG tablet Take 1 tablet (25 mg total) by mouth daily. 11/28/23   Nance Gaskins, Darice BRAVO, PA-C  naproxen  (NAPROSYN ) 500 MG tablet Take 1 tablet (500 mg total) by mouth 2 (two) times daily with a meal. As needed  for pain 11/28/23   Nance Gaskins, Darice BRAVO, PA-C  Rimegepant Sulfate  (NURTEC) 75 MG TBDP Take 1 tablet (75 mg total) by mouth every other day. 11/28/23   Nance Gaskins, Darice BRAVO, PA-C  topiramate  (TOPAMAX ) 200 MG tablet Take 1 tablet (200 mg total) by mouth daily. 11/28/23   Nance Gaskins, Darice BRAVO, PA-C  traZODone  (DESYREL ) 50 MG tablet Take 0.5-2 tablets (25-100 mg total) by mouth at bedtime. 08/01/23       Family History Family History  Problem Relation Age of Onset   Breast cancer Maternal Aunt    Colon cancer Neg Hx    Prostate cancer Neg Hx    Rectal cancer Neg Hx    Esophageal cancer Neg Hx     Social History Social History   Tobacco Use   Smoking status: Never   Smokeless tobacco: Never  Vaping Use   Vaping status: Never Used  Substance Use Topics   Alcohol  use: No   Drug use: No     Allergies   Patient has no known allergies.   Review of Systems Review of Systems  Constitutional:  Negative for fatigue and fever.  HENT:  Positive for hearing loss.  Negative for congestion, ear discharge, ear pain, rhinorrhea and sinus pain.   Respiratory:  Negative for cough.   Neurological:  Negative for dizziness and headaches.     Physical Exam Triage Vital Signs ED Triage Vitals [02/10/24 1102]  Encounter Vitals Group     BP      Girls Systolic BP Percentile      Girls Diastolic BP Percentile      Boys Systolic BP Percentile      Boys Diastolic BP Percentile      Pulse      Resp      Temp      Temp src      SpO2      Weight      Height      Head Circumference      Peak Flow      Pain Score 0     Pain Loc      Pain Education      Exclude from Growth Chart    No data found.  Updated Vital Signs BP 112/75 (BP Location: Right Arm)   Pulse (!) 54   Temp 98 F (36.7 C) (Oral)   Resp 16   LMP 06/05/2022 Comment: negative test 06/18/2022  SpO2 96%    Physical Exam Vitals and nursing note reviewed.  Constitutional:      General: She is not in acute distress.    Appearance: Normal appearance. She is not ill-appearing or toxic-appearing.  HENT:     Head: Normocephalic and atraumatic.     Right Ear: Ear canal and external ear normal. There is impacted cerumen.     Left Ear: Tympanic membrane, ear canal and external ear normal.  Eyes:     General: No scleral icterus.       Right eye: No discharge.        Left eye: No discharge.     Conjunctiva/sclera: Conjunctivae normal.  Cardiovascular:     Rate and Rhythm: Bradycardia present.  Pulmonary:     Effort: Pulmonary effort is normal. No respiratory distress.  Musculoskeletal:     Cervical back: Neck supple.  Skin:    General: Skin is dry.  Neurological:     General: No focal deficit present.     Mental Status: She  is alert. Mental status is at baseline.     Motor: No weakness.     Gait: Gait normal.  Psychiatric:        Mood and Affect: Mood normal.        Behavior: Behavior normal.      UC Treatments / Results  Labs (all labs ordered are listed, but only abnormal  results are displayed) Labs Reviewed - No data to display  EKG   Radiology No results found.  Procedures Procedures (including critical care time)  Medications Ordered in UC Medications - No data to display  Initial Impression / Assessment and Plan / UC Course  I have reviewed the triage vital signs and the nursing notes.  Pertinent labs & imaging results that were available during my care of the patient were reviewed by me and considered in my medical decision making (see chart for details).   48 y/o female presents for right ear fullness and reduced hearing x 1 weeks.   On exam, she has cerumen impaction of the right ear canal. She gives consent for otic lavage in office.  Otic lavage successful after being performed in nursing staff.  Patient reports relief of symptoms.  Reviewed return precautions.   Final Clinical Impressions(s) / UC Diagnoses   Final diagnoses:  Hearing loss due to cerumen impaction, right   Discharge Instructions   None    ED Prescriptions   None    PDMP not reviewed this encounter.   Arvis Jolan NOVAK, PA-C 02/10/24 1141

## 2024-03-02 ENCOUNTER — Telehealth: Payer: Self-pay | Admitting: Physician Assistant

## 2024-03-02 NOTE — Telephone Encounter (Signed)
 Called patient to schedule follow up appt. mail box is full unable to leave message.

## 2024-03-12 ENCOUNTER — Encounter: Admitting: Physician Assistant

## 2024-04-18 ENCOUNTER — Emergency Department

## 2024-04-18 ENCOUNTER — Other Ambulatory Visit: Payer: Self-pay

## 2024-04-18 ENCOUNTER — Inpatient Hospital Stay
Admission: EM | Admit: 2024-04-18 | Discharge: 2024-04-20 | DRG: 689 | Disposition: A | Discharge status: Home / Self Care | Attending: Internal Medicine | Admitting: Internal Medicine

## 2024-04-18 DIAGNOSIS — Z79899 Other long term (current) drug therapy: Secondary | ICD-10-CM

## 2024-04-18 DIAGNOSIS — Z87820 Personal history of traumatic brain injury: Secondary | ICD-10-CM

## 2024-04-18 DIAGNOSIS — N390 Urinary tract infection, site not specified: Principal | ICD-10-CM | POA: Diagnosis present

## 2024-04-18 DIAGNOSIS — G43909 Migraine, unspecified, not intractable, without status migrainosus: Secondary | ICD-10-CM | POA: Diagnosis present

## 2024-04-18 DIAGNOSIS — G4700 Insomnia, unspecified: Secondary | ICD-10-CM | POA: Diagnosis present

## 2024-04-18 DIAGNOSIS — Z90710 Acquired absence of both cervix and uterus: Secondary | ICD-10-CM

## 2024-04-18 DIAGNOSIS — E162 Hypoglycemia, unspecified: Secondary | ICD-10-CM | POA: Diagnosis present

## 2024-04-18 DIAGNOSIS — Z9049 Acquired absence of other specified parts of digestive tract: Secondary | ICD-10-CM

## 2024-04-18 DIAGNOSIS — K219 Gastro-esophageal reflux disease without esophagitis: Secondary | ICD-10-CM | POA: Diagnosis present

## 2024-04-18 DIAGNOSIS — F4310 Post-traumatic stress disorder, unspecified: Secondary | ICD-10-CM | POA: Diagnosis present

## 2024-04-18 DIAGNOSIS — G9340 Encephalopathy, unspecified: Principal | ICD-10-CM

## 2024-04-18 DIAGNOSIS — G9341 Metabolic encephalopathy: Secondary | ICD-10-CM | POA: Diagnosis present

## 2024-04-18 DIAGNOSIS — Z803 Family history of malignant neoplasm of breast: Secondary | ICD-10-CM

## 2024-04-18 DIAGNOSIS — I10 Essential (primary) hypertension: Secondary | ICD-10-CM | POA: Diagnosis present

## 2024-04-18 DIAGNOSIS — M7918 Myalgia, other site: Secondary | ICD-10-CM | POA: Diagnosis present

## 2024-04-18 DIAGNOSIS — N3000 Acute cystitis without hematuria: Secondary | ICD-10-CM | POA: Diagnosis not present

## 2024-04-18 DIAGNOSIS — F419 Anxiety disorder, unspecified: Secondary | ICD-10-CM | POA: Diagnosis not present

## 2024-04-18 DIAGNOSIS — F3161 Bipolar disorder, current episode mixed, mild: Secondary | ICD-10-CM | POA: Diagnosis not present

## 2024-04-18 DIAGNOSIS — E785 Hyperlipidemia, unspecified: Secondary | ICD-10-CM | POA: Diagnosis present

## 2024-04-18 DIAGNOSIS — G43009 Migraine without aura, not intractable, without status migrainosus: Secondary | ICD-10-CM | POA: Diagnosis not present

## 2024-04-18 DIAGNOSIS — E669 Obesity, unspecified: Secondary | ICD-10-CM | POA: Diagnosis present

## 2024-04-18 DIAGNOSIS — F32A Depression, unspecified: Secondary | ICD-10-CM | POA: Diagnosis not present

## 2024-04-18 DIAGNOSIS — F319 Bipolar disorder, unspecified: Secondary | ICD-10-CM | POA: Diagnosis present

## 2024-04-18 DIAGNOSIS — B9620 Unspecified Escherichia coli [E. coli] as the cause of diseases classified elsewhere: Secondary | ICD-10-CM | POA: Diagnosis present

## 2024-04-18 DIAGNOSIS — G4733 Obstructive sleep apnea (adult) (pediatric): Secondary | ICD-10-CM | POA: Diagnosis present

## 2024-04-18 DIAGNOSIS — Z6834 Body mass index (BMI) 34.0-34.9, adult: Secondary | ICD-10-CM | POA: Diagnosis not present

## 2024-04-18 DIAGNOSIS — R4182 Altered mental status, unspecified: Secondary | ICD-10-CM | POA: Diagnosis present

## 2024-04-18 LAB — CBC
HCT: 45.9 % (ref 36.0–46.0)
Hemoglobin: 14.4 g/dL (ref 12.0–15.0)
MCH: 29.2 pg (ref 26.0–34.0)
MCHC: 31.4 g/dL (ref 30.0–36.0)
MCV: 93.1 fL (ref 80.0–100.0)
Platelets: 257 K/uL (ref 150–400)
RBC: 4.93 MIL/uL (ref 3.87–5.11)
RDW: 11.8 % (ref 11.5–15.5)
WBC: 9.6 K/uL (ref 4.0–10.5)
nRBC: 0 % (ref 0.0–0.2)

## 2024-04-18 LAB — COMPREHENSIVE METABOLIC PANEL WITH GFR
ALT: 10 U/L (ref 0–44)
AST: 14 U/L — ABNORMAL LOW (ref 15–41)
Albumin: 4 g/dL (ref 3.5–5.0)
Alkaline Phosphatase: 89 U/L (ref 38–126)
Anion gap: 12 (ref 5–15)
BUN: 11 mg/dL (ref 6–20)
CO2: 22 mmol/L (ref 22–32)
Calcium: 9.6 mg/dL (ref 8.9–10.3)
Chloride: 106 mmol/L (ref 98–111)
Creatinine, Ser: 1.23 mg/dL — ABNORMAL HIGH (ref 0.44–1.00)
GFR, Estimated: 55 mL/min — ABNORMAL LOW
Glucose, Bld: 92 mg/dL (ref 70–99)
Potassium: 3.7 mmol/L (ref 3.5–5.1)
Sodium: 140 mmol/L (ref 135–145)
Total Bilirubin: 0.3 mg/dL (ref 0.0–1.2)
Total Protein: 7.4 g/dL (ref 6.5–8.1)

## 2024-04-18 LAB — URINALYSIS, ROUTINE W REFLEX MICROSCOPIC
Bilirubin Urine: NEGATIVE
Glucose, UA: NEGATIVE mg/dL
Ketones, ur: NEGATIVE mg/dL
Nitrite: NEGATIVE
Protein, ur: NEGATIVE mg/dL
Specific Gravity, Urine: 1.012 (ref 1.005–1.030)
WBC, UA: >50 WBC/hpf (ref 0–5)
pH: 6 (ref 5.0–8.0)

## 2024-04-18 LAB — URINE DRUG SCREEN
Amphetamines: NEGATIVE
Barbiturates: NEGATIVE
Benzodiazepines: POSITIVE — AB
Cocaine: NEGATIVE
Fentanyl: NEGATIVE
Methadone Scn, Ur: NEGATIVE
Opiates: NEGATIVE
Tetrahydrocannabinol: NEGATIVE

## 2024-04-18 LAB — AMMONIA: Ammonia: 18 umol/L (ref 9–35)

## 2024-04-18 LAB — ETHANOL: Alcohol, Ethyl (B): <15 mg/dL

## 2024-04-18 LAB — BLOOD GAS, VENOUS
Acid-base deficit: 2 mmol/L (ref 0.0–2.0)
Bicarbonate: 25.1 mmol/L (ref 20.0–28.0)
O2 Saturation: 61.3 %
Patient temperature: 37
pCO2, Ven: 51 mmHg (ref 44–60)
pH, Ven: 7.3 (ref 7.25–7.43)
pO2, Ven: 36 mmHg (ref 32–45)

## 2024-04-18 LAB — PREGNANCY, URINE: Preg Test, Ur: NEGATIVE

## 2024-04-18 LAB — TSH: TSH: 1.97 u[IU]/mL (ref 0.350–4.500)

## 2024-04-18 LAB — LITHIUM LEVEL: Lithium Lvl: 0.25 mmol/L — ABNORMAL LOW (ref 0.60–1.20)

## 2024-04-18 MED ORDER — ENOXAPARIN SODIUM 40 MG/0.4ML IJ SOSY
40.0000 mg | PREFILLED_SYRINGE | INTRAMUSCULAR | Status: DC
  Administered 2024-04-18 – 2024-04-19 (×2): 40 mg via SUBCUTANEOUS
  Filled 2024-04-18: qty 0.4

## 2024-04-18 MED ORDER — ACETAMINOPHEN 325 MG PO TABS: 650.0000 mg | ORAL_TABLET | Freq: Four times a day (QID) | ORAL | Status: DC | PRN

## 2024-04-18 MED ORDER — TOPIRAMATE 100 MG PO TABS: 200.0000 mg | ORAL_TABLET | Freq: Every day | ORAL | Status: DC

## 2024-04-18 MED ORDER — ONDANSETRON HCL 4 MG/2ML IJ SOLN: 4.0000 mg | Freq: Four times a day (QID) | INTRAMUSCULAR | Status: DC | PRN

## 2024-04-18 MED ORDER — SODIUM CHLORIDE 0.9 % IV BOLUS
1000.0000 mL | Freq: Once | INTRAVENOUS | Status: AC
  Administered 2024-04-18: 1000 mL via INTRAVENOUS

## 2024-04-18 MED ORDER — GABAPENTIN 300 MG PO CAPS: 300.0000 mg | ORAL_CAPSULE | ORAL | Status: DC

## 2024-04-18 MED ORDER — ACETAMINOPHEN 650 MG RE SUPP: 650.0000 mg | Freq: Four times a day (QID) | RECTAL | Status: DC | PRN

## 2024-04-18 MED ORDER — ZIPRASIDONE MESYLATE 20 MG IM SOLR: 10.0000 mg | Freq: Four times a day (QID) | INTRAMUSCULAR | Status: DC | PRN

## 2024-04-18 MED ORDER — BUSPIRONE HCL 10 MG PO TABS: 15.0000 mg | ORAL_TABLET | Freq: Two times a day (BID) | ORAL | Status: DC

## 2024-04-18 MED ORDER — METOPROLOL TARTRATE 25 MG PO TABS
25.0000 mg | ORAL_TABLET | Freq: Every day | ORAL | Status: DC
  Administered 2024-04-20: 25 mg via ORAL

## 2024-04-18 MED ORDER — MAGNESIUM HYDROXIDE 400 MG/5ML PO SUSP: 30.0000 mL | Freq: Every day | ORAL | Status: DC | PRN

## 2024-04-18 MED ORDER — SODIUM CHLORIDE 0.9 % IV SOLN
1.0000 g | Freq: Once | INTRAVENOUS | Status: AC
  Administered 2024-04-18: 1 g via INTRAVENOUS
  Filled 2024-04-18: qty 10

## 2024-04-18 MED ORDER — ONDANSETRON HCL 4 MG PO TABS: 4.0000 mg | ORAL_TABLET | Freq: Four times a day (QID) | ORAL | Status: DC | PRN

## 2024-04-18 MED ORDER — CARIPRAZINE HCL 1.5 MG PO CAPS
4.5000 mg | ORAL_CAPSULE | Freq: Every day | ORAL | Status: DC
  Filled 2024-04-18: qty 3

## 2024-04-18 MED ORDER — CLONAZEPAM 1 MG PO TABS: 1.0000 mg | ORAL_TABLET | Freq: Three times a day (TID) | ORAL | Status: DC | PRN

## 2024-04-18 MED ORDER — OLANZAPINE 5 MG PO TABS
5.0000 mg | ORAL_TABLET | Freq: Two times a day (BID) | ORAL | Status: DC
  Administered 2024-04-19 – 2024-04-20 (×3): 5 mg via ORAL

## 2024-04-18 MED ORDER — SODIUM CHLORIDE 0.9 % IV SOLN: INTRAVENOUS | Status: AC

## 2024-04-18 MED ORDER — TRAZODONE HCL 50 MG PO TABS: 25.0000 mg | ORAL_TABLET | Freq: Every evening | ORAL | Status: DC | PRN

## 2024-04-18 MED ORDER — SODIUM CHLORIDE 0.9 % IV SOLN
1.0000 g | INTRAVENOUS | Status: DC
  Administered 2024-04-19: 1 g via INTRAVENOUS

## 2024-04-18 NOTE — H&P (Addendum)
 "     [facility]   PATIENT NAME: Kelly [name]    MR#:  [id number]  DATE OF BIRTH:  1976/01/10  DATE OF ADMISSION:  04/18/2024  PRIMARY CARE PHYSICIAN: Nance Gretta Darice FORBES, PA-C   Patient is coming from: Home  REQUESTING/REFERRING PHYSICIAN: Nicholaus Maize, MD  CHIEF COMPLAINT:   Chief Complaint  Patient presents with   Altered Mental Status    HISTORY OF PRESENT ILLNESS:  Kelly [name] is a 48 y.o. Caucasian female with medical history significant for anxiety, depression and bipolar disorder, GERD, hypertension and dyslipidemia as well as OSA, who presented to the emergency room with acute onset of altered mental status.  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  She has been having hallucinations since. Timing does seem to correlate with initiation of lithium  per daughter.  The patient is normally however able to answer all questions to her or ADLs and since Monday has been acutely confused.  She was at her therapist today when EMS was called. She was hypoglycemic there.  This improved after EMS intervention.  She has not been hypoglycemic here in the ED.  No nausea or vomiting or abdominal pain.  No dysuria, oliguria or hematuria, urinary urgency or frequency or flank pain.  No cough or wheezing or dyspnea.  No chest pain or palpitations.  She was fairly somnolent but arousable during my interview.  She denied any suicidal thoughts or attempts.  ED Course: When she came to the ER, BP was 85/59 with temperature 97.5 and otherwise normal vital signs.  BP was up with hydration to 116/49 and heart rate 61 and later 58.  CMP was remarkable for creatinine 1.23.  Ammonia level was 18.  CBC was normal.  Lithium  level was low at 0.25.  Urine pregnancy test was negative.  TSH was normal at 1.97.  UA was positive for UTI.  Urine culture was sent.  Urine drug screen was positive for benzodiazepines. EKG as reviewed by me : EKG showed normal sinus rhythm with a rate of 68 with low voltage  QRS and T wave inversion anteroseptally with Q waves inferiorly. Imaging: Noncontrasted head CT scan revealed no acute intracranial abnormalities.  The patient was given 1 L bolus of IV normal saline and 1 g of IV Rocephin .  She will be admitted to a telemetry bed for further evaluation and management. PAST MEDICAL HISTORY:   Past Medical History:  Diagnosis Date   Anxiety    Depression    GERD (gastroesophageal reflux disease)    Headache    Hyperlipemia    Hypertension    Myofascial pain syndrome    Sleep apnea     PAST SURGICAL HISTORY:   Past Surgical History:  Procedure Laterality Date   CESAREAN SECTION     CHOLECYSTECTOMY     COLPOSCOPY W/ BIOPSY / CURETTAGE  02/28/2022   TUBAL LIGATION     VAGINAL HYSTERECTOMY Bilateral 06/18/2022   Procedure: HYSTERECTOMY VAGINAL WITH BILATERAL SALPINGECTOMY;  Surgeon: Fredirick Glenys RAMAN, MD;  Location: [facility] OR;  Service: Gynecology;  Laterality: Bilateral;    SOCIAL HISTORY:   Social History   Tobacco Use   Smoking status: Never   Smokeless tobacco: Never  Substance Use Topics   Alcohol  use: No    FAMILY HISTORY:   Family History  Problem Relation Age of Onset   Breast cancer Maternal Aunt    Colon cancer Neg Hx    Prostate cancer Neg Hx  Rectal cancer Neg Hx    Esophageal cancer Neg Hx     DRUG ALLERGIES:  Allergies[1]  REVIEW OF SYSTEMS:   ROS As per history of present illness. All pertinent systems were reviewed above. Constitutional, HEENT, cardiovascular, respiratory, GI, GU, musculoskeletal, neuro, psychiatric, endocrine, integumentary and hematologic systems were reviewed and are otherwise negative/unremarkable except for positive findings mentioned above in the HPI.   MEDICATIONS AT HOME:   Prior to Admission medications  Medication Sig Start Date End Date Taking? Authorizing Provider  busPIRone  (BUSPAR ) 15 MG tablet Take 1 tablet (15 mg total) by mouth 2 (two) times daily. 08/01/23  Yes   Cariprazine  HCl  (VRAYLAR ) 4.5 MG CAPS Take 1 capsule (4.5 mg total) by mouth daily. 08/01/23  Yes   clonazePAM  (KLONOPIN ) 1 MG tablet Take 1 tablet (1 mg total) by mouth daily as needed. 05/05/23  Yes   cyclobenzaprine  (FLEXERIL ) 10 MG tablet Take 1 tablet (10 mg total) by mouth every 8 (eight) hours as needed for muscle spasms (headache). 11/28/23  Yes Teague Gretta Darice BRAVO, PA-C  gabapentin  (NEURONTIN ) 300 MG capsule Take 1 capsule (300 mg total) by mouth PRO. Use 1 Q AM, 3 at bedtime and 1 PRN 11/28/23  Yes Teague Gretta Darice BRAVO, PA-C  lithium  carbonate 300 MG capsule Take 1 capsule (300 mg total) by mouth 2 (two) times daily. 08/01/23  Yes   metoprolol  tartrate (LOPRESSOR ) 25 MG tablet Take 1 tablet (25 mg total) by mouth daily. 11/28/23  Yes Teague Gretta Darice BRAVO, PA-C  naproxen  (NAPROSYN ) 500 MG tablet Take 1 tablet (500 mg total) by mouth 2 (two) times daily with a meal. As needed for pain 11/28/23  Yes Teague Gretta Darice BRAVO, PA-C  Rimegepant Sulfate  (NURTEC) 75 MG TBDP Take 1 tablet (75 mg total) by mouth every other day. 11/28/23  Yes Teague Gretta Darice BRAVO, PA-C  topiramate  (TOPAMAX ) 200 MG tablet Take 1 tablet (200 mg total) by mouth daily. 11/28/23  Yes Teague Gretta Darice BRAVO, PA-C  traZODone  (DESYREL ) 50 MG tablet Take 0.5-2 tablets (25-100 mg total) by mouth at bedtime. 08/01/23  Yes   benztropine  (COGENTIN ) 1 MG tablet Take 2 tablets (2 mg total) by mouth every morning AND 1 tablet (1 mg total) 2 (two) times daily in the afternoon and in the evening. Patient not taking: Reported on 04/18/2024 08/01/23     gabapentin  (NEURONTIN ) 300 MG capsule Take 1-4 capsules (300-1,200 mg total) by mouth at bedtime. Patient not taking: Reported on 04/18/2024 08/01/23         VITAL SIGNS:  Blood pressure 138/74, pulse (!) 59, temperature (!) 97.5 F (36.4 C), resp. rate 16, height 5' 7 (1.702 m), weight 101 kg, last menstrual period 06/05/2022, SpO2 100%.  PHYSICAL EXAMINATION:  Physical Exam  GENERAL:  48 y.o.-year-old  Caucasian female patient lying in the bed with no acute distress.  She was fairly somnolent but easily arousable during my interview. EYES: Pupils equal, round, reactive to light and accommodation. No scleral icterus. Extraocular muscles intact.  HEENT: Head atraumatic, normocephalic. Oropharynx and nasopharynx clear.  NECK:  Supple, no jugular venous distention. No thyroid  enlargement, no tenderness.  LUNGS: Normal breath sounds bilaterally, no wheezing, rales,rhonchi or crepitation. No use of accessory muscles of respiration.  CARDIOVASCULAR: Regular rate and rhythm, S1, S2 normal. No murmurs, rubs, or gallops.  ABDOMEN: Soft, nondistended, nontender. Bowel sounds present. No organomegaly or mass.  EXTREMITIES: No pedal edema, cyanosis, or clubbing.  NEUROLOGIC: Cranial nerves II through  XII are intact. Muscle strength 5/5 in all extremities. Sensation intact. Gait not checked.  PSYCHIATRIC: The patient is somnolent but arousable with no good eye contact. SKIN: No obvious rash, lesion, or ulcer.   LABORATORY PANEL:   CBC Recent Labs  Lab 04/18/24 1218  WBC 9.6  HGB 14.4  HCT 45.9  PLT 257   ------------------------------------------------------------------------------------------------------------------  Chemistries  Recent Labs  Lab 04/18/24 1218  NA 140  K 3.7  CL 106  CO2 22  GLUCOSE 92  BUN 11  CREATININE 1.23*  CALCIUM  9.6  AST 14*  ALT 10  ALKPHOS 89  BILITOT 0.3   ------------------------------------------------------------------------------------------------------------------  Cardiac Enzymes No results for input(s): TROPONINI in the last 168 hours. ------------------------------------------------------------------------------------------------------------------  RADIOLOGY:  CT Head Wo Contrast Result Date: 04/18/2024 EXAM: CT HEAD WITHOUT CONTRAST 04/18/2024 12:46:54 PM TECHNIQUE: CT of the head was performed without the administration of intravenous  contrast. Automated exposure control, iterative reconstruction, and/or weight based adjustment of the mA/kV was utilized to reduce the radiation dose to as low as reasonably achievable. COMPARISON: Head CT 06/12/2023. CLINICAL HISTORY: 48 year old female with altered mental status and hypoglycemia. FINDINGS: BRAIN AND VENTRICLES: No acute hemorrhage. No evidence of acute infarct. No hydrocephalus. No extra-axial collection. No mass effect or midline shift. Normal brain volume. Gray white differentiation stable and normal. No suspicious intracranial vascular hyperdensity. ORBITS: No acute abnormality. SINUSES: Paranasal sinuses are clear. SOFT TISSUES AND SKULL: No acute soft tissue abnormality. No skull fracture. Tympanic cavities and mastoids are clear. IMPRESSION: 1. Normal non-contrast head CT. Electronically signed by: Helayne Hurst MD 04/18/2024 12:55 PM EST RP Workstation: [id number]      IMPRESSION AND PLAN:  Assessment and Plan: * Acute metabolic encephalopathy - This is likely related to her acute lower UTI and possibly psychiatric origin. - She will be admitted to an telemetry bed. - Will continue antibiotic therapy for her UTI with IV Rocephin . - Will follow urine culture and sensitivity. - Will continue hydration with IV normal saline. - Will follow neurochecks every 4 hours for 24 hours. - Psychiatry consult was requested for further assessment of her medications. - I notified Dr. Hellen. - Given her hallucinations, we will utilize as needed IM Geodon  if they continue despite management of her UTI.  Bipolar disorder (HCC) - We will continue her Vraylar , Klonopin  and Topamax  while holding off lithium  pending psychiatry evaluation.  It is level is subtherapeutic anyway. - Also with this will be held off for sedation though.  Anxiety and depression - Will continue BuSpar  and trazodone .  Migraine - Will continue Nurtec and Topamax .   DVT prophylaxis: Lovenox .  Advanced Care  Planning:  Code Status: full code.  Family Communication:  The plan of care was discussed in details with the patient (and family). I answered all questions. The patient agreed to proceed with the above mentioned plan. Further management will depend upon hospital course. Disposition Plan: Back to previous home environment Consults called: Psychiatry All the records are reviewed and case discussed with ED provider.  Status is: Inpatient  At the time of the admission, it appears that the appropriate admission status for this patient is inpatient.  This is judged to be reasonable and necessary in order to provide the required intensity of service to ensure the patient's safety given the presenting symptoms, physical exam findings and initial radiographic and laboratory data in the context of comorbid conditions.  The patient requires inpatient status due to high intensity of service, high risk of further deterioration  and high frequency of surveillance required.  I certify that at the time of admission, it is my clinical judgment that the patient will require inpatient hospital care extending more than 2 midnights.                            Dispo: The patient is from: Home              Anticipated d/c is to: Home              Patient currently is not medically stable to d/c.              Difficult to place patient: No  Madison DELENA Peaches M.D on 04/18/2024 at 4:58 PM  [facility]   From 7 PM-7 AM, contact night-coverage [email or web address]  CC: Primary care physician; Nance Gaskins, Darice BRAVO, PA-C     [1] No Known Allergies  "

## 2024-04-18 NOTE — Assessment & Plan Note (Addendum)
 This is likely related to her acute lower UTI and possibly psychiatric origin. Seems improving, disoriented to place. CT head was negative for any acute abnormality. - Continue to monitor

## 2024-04-18 NOTE — Plan of Care (Signed)

## 2024-04-18 NOTE — Assessment & Plan Note (Addendum)
-   Will continue Nurtec and Topamax .

## 2024-04-18 NOTE — Assessment & Plan Note (Addendum)
 Psych was consulted. - Will continue her Vraylar , Klonopin  and Topamax  while holding off lithium  pending psychiatry evaluation.  It is level is subtherapeutic anyway.

## 2024-04-18 NOTE — Assessment & Plan Note (Signed)
-   Will continue BuSpar and trazodone

## 2024-04-18 NOTE — ED Notes (Signed)
 EDP at bedside. Pt states she has been feeling off and having hallucinations since [place] and since yesterday. Pt denies PMH. Pt appears to have slowed responses and follows commands. Equal strength in all extremities. No arm drift.

## 2024-04-18 NOTE — ED Triage Notes (Signed)
 Pt come via EMS from [facility]. Pt from home. Pt therapist requested ems to be called. Per family pt has not been herself all week. Pt did have fall hitting her head back in Jan/Feb and has not been same since.  CBG 49 pt is not diabetic. Pt ate last night and this morning.   CBG 57 after oral glucose and some food. Last check was 104.\  Pt AX3 with ems.

## 2024-04-18 NOTE — ED Provider Notes (Signed)
 "  [facility] Provider Note    Event Date/Time   First MD Initiated Contact with Patient 04/18/24 1229     (approximate)   History   Altered Mental Status   HPI  Kelly [name] is a 48 y.o. female  PMH PTSD, depression, anxiety, panic attacks, obstructive sleep apnea, chronic headaches presenting for hallucinations after [place] earlier this jail who presents to the emergency department with increased confusion, hallucinations and generalized weakness of 6 days time.  History is obtained by patient's daughter via telephone and EMS report.  Patient normally is able to do most of her ADLs and is appropriately conversive however starting Monday of last week became increasingly confused.  There have been no recent falls though has not been any episodes of extremity weakness.  Per daughter, patient has taken home medications as prescribed.  Daughter has noticed January since February upon initiation of lithium  patient has has been having hallucinations.  Patient was at her therapist today and was more confused than usual so EMS was called.  Upon their arrival patient was hypoglycemic and was repleted.  Patient is unable to contribute to any of the history today      Physical Exam   Triage Vital Signs: ED Triage Vitals  Encounter Vitals Group     BP 04/18/24 1226 (!) 85/59     Girls Systolic BP Percentile --      Girls Diastolic BP Percentile --      Boys Systolic BP Percentile --      Boys Diastolic BP Percentile --      Pulse Rate 04/18/24 1226 66     Resp 04/18/24 1226 16     Temp 04/18/24 1226 (!) 97.5 F (36.4 C)     Temp Source 04/18/24 1226 Oral     SpO2 04/18/24 1226 95 %     Weight --      Height --      Head Circumference --      Peak Flow --      Pain Score 04/18/24 1217 0     Pain Loc --      Pain Education --      Exclude from Growth Chart --     Most recent vital signs: Vitals:   04/18/24 1300 04/18/24 1505  BP: 99/81 95/84   Pulse: 61 62  Resp: 14 14  Temp:    SpO2: 100% 100%    Nursing Triage Note reviewed. Vital signs reviewed and patients oxygen saturation is normoxic  General: Patient is well nourished, well developed, awake and alert, resting comfortably in no acute distress Head: Normocephalic and atraumatic Eyes: Normal inspection, extraocular muscles intact, no conjunctival pallor Ear, nose, throat: Normal external exam Neck: Normal range of motion Respiratory: Patient is in no respiratory distress, lungs CTAB Cardiovascular: Patient is not tachycardic, RRR without murmur appreciated GI: Abd SNT with no guarding or rebound  Back: Normal inspection of the back with good strength and range of motion throughout all ext Extremities: pulses intact with good cap refills, no LE pitting edema or calf tenderness Neuro: The patient is alert and oriented to person, not to place or time with 5/5 bilat UE/LE strength, no gross motor or sensory defects noted.  I am unable to assess coordination as patient does not follow directions Skin: Warm, dry, and intact   ED Results / Procedures / Treatments   Labs (all labs ordered are listed, but only abnormal results are displayed) Labs  Reviewed  COMPREHENSIVE METABOLIC PANEL WITH GFR - Abnormal; Notable for the following components:      Result Value   Creatinine, Ser 1.23 (*)    AST 14 (*)    GFR, Estimated 55 (*)    All other components within normal limits  URINALYSIS, ROUTINE W REFLEX MICROSCOPIC - Abnormal; Notable for the following components:   Color, Urine YELLOW (*)    APPearance HAZY (*)    Hgb urine dipstick SMALL (*)    Leukocytes,Ua LARGE (*)    Bacteria, UA MANY (*)    All other components within normal limits  URINE DRUG SCREEN - Abnormal; Notable for the following components:   Benzodiazepines POSITIVE (*)    All other components within normal limits  LITHIUM  LEVEL - Abnormal; Notable for the following components:   Lithium  Lvl 0.25 (*)     All other components within normal limits  URINE CULTURE  CBC  BLOOD GAS, VENOUS  TSH  AMMONIA  ETHANOL  PREGNANCY, URINE  HIV ANTIBODY (ROUTINE TESTING W REFLEX)     EKG EKG and rhythm strip are interpreted by myself:   EKG: [Normal sinus rhythm] at heart rate of 68, normal QRS duration, QTc 435, nonspecific ST segments and T waves no ectopy EKG not consistent with Acute STEMI Rhythm strip: NSR in lead II   RADIOLOGY CT head: No acute abnormality on my independent review interpretation radiologist agrees    PROCEDURES:  Critical Care performed: No  Procedures   MEDICATIONS ORDERED IN ED: Medications  metoprolol  tartrate (LOPRESSOR ) tablet 25 mg (has no administration in time range)  busPIRone  (BUSPAR ) tablet 15 mg (has no administration in time range)  cariprazine  (VRAYLAR ) capsule 4.5 mg (has no administration in time range)  traZODone  (DESYREL ) tablet 25-100 mg (has no administration in time range)  clonazePAM  (KLONOPIN ) tablet 1 mg (has no administration in time range)  topiramate  (TOPAMAX ) tablet 200 mg (has no administration in time range)  enoxaparin  (LOVENOX ) injection 40 mg (has no administration in time range)  0.9 %  sodium chloride  infusion (has no administration in time range)  acetaminophen  (TYLENOL ) tablet 650 mg (has no administration in time range)    Or  acetaminophen  (TYLENOL ) suppository 650 mg (has no administration in time range)  magnesium  hydroxide (MILK OF MAGNESIA) suspension 30 mL (has no administration in time range)  ondansetron  (ZOFRAN ) tablet 4 mg (has no administration in time range)    Or  ondansetron  (ZOFRAN ) injection 4 mg (has no administration in time range)  sodium chloride  0.9 % bolus 1,000 mL ( Intravenous Restarted 04/18/24 1314)  cefTRIAXone  (ROCEPHIN ) 1 g in sodium chloride  0.9 % 100 mL IVPB (1 g Intravenous New Bag/Given 04/18/24 1412)     IMPRESSION / MDM / ASSESSMENT AND PLAN / ED COURSE                                 Differential diagnosis includes, but is not limited to, organic psychiatric disorder, intracranial hemorrhage, NMDA encephalitis, substance use, UTI  ED course: Patient presents with 6 days of progressively worsening symptoms but neurological exam seems nonfocal on my assessment.  She has no leukocytosis, TSH is within normal limits.  She has no profound electrolyte derangements.  Urinalysis may be consistent with UTI and consequently I did give a dose of ceftriaxone .  It is unclear to me at this time overall patient's presentation is consistent with an organic psychiatric disorder,  metabolic encephalopathy or an infectious cause.  Will consult hospitalist for admission.  I have placed a psychiatric consult.  Hospitalist to consider possible neurology and consideration of lumbar puncture (no evidence of meningismus on exam but low concern for NMDA encephalitis) versus MRI.  Daughter in agreement with this plan   Clinical Course as of 04/18/24 1514  Sun Apr 18, 2024  1318 Glucose: 92 Not low [HD]  1354 WBC, UA: >50 May be consistent with UTI will send a urine culture [HD]  1356 WBC, UA: >50 Normally does not have as many white blood cells [HD]  1358 Calling emergency contact: [place], patients daughter.  [HD]  1417 Case discussed with hospitalist for admission [HD]    Clinical Course User Index [HD] Nicholaus Rolland BRAVO, MD   -- Risk: 5 This patient has a high risk of morbidity due to further diagnostic testing or treatment. Rationale: This patients evaluation and management involve a high risk of morbidity due to the potential severity of presenting symptoms, need for diagnostic testing, and/or initiation of treatment that may require close monitoring. The differential includes conditions with potential for significant deterioration or requiring escalation of care. Treatment decisions in the ED, including medication administration, procedural interventions, or disposition planning, reflect  this level of risk. COPA: 5 The patient has the following acute or chronic illness/injury that poses a possible threat to life or bodily function: [X] : The patient has a potentially serious acute condition or an acute exacerbation of a chronic illness requiring urgent evaluation and management in the Emergency Department. The clinical presentation necessitates immediate consideration of life-threatening or function-threatening diagnoses, even if they are ultimately ruled out.   FINAL CLINICAL IMPRESSION(S) / ED DIAGNOSES   Final diagnoses:  Acute encephalopathy  Hypoglycemia  Urinary tract infection without hematuria, site unspecified     Rx / DC Orders   ED Discharge Orders     None        Note:  This document was prepared using Dragon voice recognition software and may include unintentional dictation errors.   Nicholaus Rolland BRAVO, MD 04/18/24 [phone]  "

## 2024-04-18 NOTE — ED Notes (Signed)
 Advised nurse that patient has ready bed

## 2024-04-18 NOTE — ED Notes (Signed)
 Pt to CT then will get rest of labs etc.
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
## 2024-04-19 ENCOUNTER — Inpatient Hospital Stay

## 2024-04-19 DIAGNOSIS — E162 Hypoglycemia, unspecified: Secondary | ICD-10-CM | POA: Diagnosis not present

## 2024-04-19 DIAGNOSIS — N390 Urinary tract infection, site not specified: Secondary | ICD-10-CM | POA: Diagnosis not present

## 2024-04-19 DIAGNOSIS — G9341 Metabolic encephalopathy: Secondary | ICD-10-CM | POA: Diagnosis not present

## 2024-04-19 DIAGNOSIS — F419 Anxiety disorder, unspecified: Secondary | ICD-10-CM | POA: Diagnosis not present

## 2024-04-19 DIAGNOSIS — F32A Depression, unspecified: Secondary | ICD-10-CM

## 2024-04-19 DIAGNOSIS — I10 Essential (primary) hypertension: Secondary | ICD-10-CM

## 2024-04-19 DIAGNOSIS — N3000 Acute cystitis without hematuria: Secondary | ICD-10-CM | POA: Diagnosis not present

## 2024-04-19 LAB — BASIC METABOLIC PANEL WITH GFR
Anion gap: 9 (ref 5–15)
BUN: 10 mg/dL (ref 6–20)
CO2: 21 mmol/L — ABNORMAL LOW (ref 22–32)
Calcium: 8.8 mg/dL — ABNORMAL LOW (ref 8.9–10.3)
Chloride: 110 mmol/L (ref 98–111)
Creatinine, Ser: 1.02 mg/dL — ABNORMAL HIGH (ref 0.44–1.00)
GFR, Estimated: >60 mL/min
Glucose, Bld: 92 mg/dL (ref 70–99)
Potassium: 3.6 mmol/L (ref 3.5–5.1)
Sodium: 141 mmol/L (ref 135–145)

## 2024-04-19 LAB — VITAMIN B12: Vitamin B-12: 433 pg/mL (ref 180–914)

## 2024-04-19 LAB — CBC
HCT: 40.3 % (ref 36.0–46.0)
Hemoglobin: 12.9 g/dL (ref 12.0–15.0)
MCH: 29.3 pg (ref 26.0–34.0)
MCHC: 32 g/dL (ref 30.0–36.0)
MCV: 91.6 fL (ref 80.0–100.0)
Platelets: 267 K/uL (ref 150–400)
RBC: 4.4 MIL/uL (ref 3.87–5.11)
RDW: 11.7 % (ref 11.5–15.5)
WBC: 6.6 K/uL (ref 4.0–10.5)
nRBC: 0 % (ref 0.0–0.2)

## 2024-04-19 LAB — RAPID HIV SCREEN (HIV 1/2 AB+AG)
HIV 1/2 Antibodies: NONREACTIVE
HIV-1 P24 Antigen - HIV24: NONREACTIVE

## 2024-04-19 LAB — HIV ANTIBODY (ROUTINE TESTING W REFLEX): HIV Screen 4th Generation wRfx: NONREACTIVE

## 2024-04-19 LAB — LITHIUM LEVEL: Lithium Lvl: 0.21 mmol/L — ABNORMAL LOW (ref 0.60–1.20)

## 2024-04-19 LAB — FOLATE: Folate: 3.6 ng/mL — ABNORMAL LOW

## 2024-04-19 MED ORDER — GADOBUTROL 1 MMOL/ML IV SOLN
10.0000 mL | Freq: Once | INTRAVENOUS | Status: AC | PRN
  Administered 2024-04-19: 10 mL via INTRAVENOUS

## 2024-04-19 MED ORDER — FOLIC ACID 1 MG PO TABS
1.0000 mg | ORAL_TABLET | Freq: Every day | ORAL | Status: DC
  Administered 2024-04-19 – 2024-04-20 (×2): 1 mg via ORAL
  Filled 2024-04-19 (×2): qty 1

## 2024-04-19 MED ORDER — RAMELTEON 8 MG PO TABS
8.0000 mg | ORAL_TABLET | Freq: Every day | ORAL | Status: DC
  Administered 2024-04-19: 8 mg via ORAL
  Filled 2024-04-19 (×2): qty 1
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
## 2024-04-19 NOTE — Assessment & Plan Note (Signed)
 This is likely related to her acute lower UTI and possibly psychiatric origin. Seems improving, disoriented to place. CT head was negative for any acute abnormality. - Continue to monitor - Family is also concerned about postconcussion syndrome, MRI of brain was ordered to rule out any structural abnormality. - UDS was positive for benzo which can be contributory. - Psych holding most of her psych meds for now.

## 2024-04-19 NOTE — Progress Notes (Addendum)
 " Progress Note   Patient: Kelly [name] FMW:[id number] DOB: [birth year] DOA: 04/18/2024     1 DOS: the patient was seen and examined on 04/19/2024   Brief hospital course: Partly taken from H&P.  Gizzelle L Randa is a 48 y.o. Caucasian female with medical history significant for anxiety, depression and bipolar disorder, GERD, hypertension and dyslipidemia as well as OSA, who presented to the emergency room with acute onset of altered mental status.  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  She has been having hallucinations since. Timing does seem to correlate with initiation of lithium  per daughter.  Patient was found to be hypoglycemic by EMS which improved with intervention.  On presentation borderline soft blood pressure at 85/59, rest of the vital stable.  Blood pressure improved with hydration.  Labs with creatinine of 1.23, ammonia 18, lithium  levels were low at 0.25.  TSH normal at 1.97.  UA positive for UTI and urine cultures were sent.  UDS positive for benzodiazepines. EKG with NSR, low voltage QRS and T wave inversion anteroseptally with Q waves inferiorly. CT head was negative for any acute abnormality.  Patient was started on ceftriaxone  for concern of UTI.  12/29: Vital stable, labs with improving creatinine, leukocytosis resolved, folic acid  low at 3.6-starting on supplement.  B12 at 433, lithium  levels with further decreased to 0.21. Improving mentation, just disoriented to place at this time.  Per patient she takes lithium  for a while. Pending urine cultures. MRI brain was also ordered to rule out any structural abnormality.   Assessment and Plan: * Acute metabolic encephalopathy This is likely related to her acute lower UTI and possibly psychiatric origin. Seems improving, disoriented to place. CT head was negative for any acute abnormality. - Continue to monitor - Family is also concerned about postconcussion syndrome, MRI of brain was ordered to rule out any  structural abnormality. - UDS was positive for benzo which can be contributory. - Psych holding most of her psych meds for now.   UTI (urinary tract infection) Leukocytosis and UA concerning for UTI on admission. Urine cultures pending. -Continue with ceftriaxone  -Follow-up urine culture results  Anxiety and depression - Will continue BuSpar  and trazodone .  Bipolar disorder (HCC) Psych was consulted. - They were holding most of her home psych medications, subtherapeutic lithium  levels. - Continue current dose of Zyprexa  and as needed Geodon  for now    Migraine - Will continue Nurtec and Topamax .  Hypertension Blood pressure within goal. -Continue home metoprolol    Subjective: Patient was seen and examined today.  No new concern.  She knows that she is in hospital but unable to name the hospital.  Denies any pain.  Physical Exam: Vitals:   04/18/24 2042 04/19/24 0305 04/19/24 0801 04/19/24 1402  BP: 95/65 (!) 86/59 (!) 98/58 111/72  Pulse: (!) 56 (!) 58 66 68  Resp: 14 14 16 16   Temp: (!) 97.4 F (36.3 C) 98 F (36.7 C) 98 F (36.7 C) 97.8 F (36.6 C)  TempSrc: Oral Oral    SpO2: 100% 99%  100%  Weight:      Height:       General.  Obese lady, in no acute distress. Pulmonary.  Lungs clear bilaterally, normal respiratory effort. CV.  Regular rate and rhythm, no JVD, rub or murmur. Abdomen.  Soft, nontender, nondistended, BS positive. CNS.  Alert and oriented, less oriented to place.  No focal neurologic deficit. Extremities.  No edema, no cyanosis, pulses intact and symmetrical.  Data Reviewed: Prior data reviewed  Family Communication: Talked with daughter on phone.  Disposition: Status is: Inpatient Remains inpatient appropriate because: Severity of illness  Planned Discharge Destination: Home  DVT prophylaxis.  Lovenox  Time spent: 50 minutes  This record has been created using Conservation officer, historic buildings. Errors have been sought and  corrected,but may not always be located. Such creation errors do not reflect on the standard of care.   Author: Amaryllis Dare, MD 04/19/2024 3:43 PM  For on call review [email or web address].  "

## 2024-04-19 NOTE — Assessment & Plan Note (Signed)
 Psych was consulted. - They were holding most of her home psych medications, subtherapeutic lithium  levels. - Continue current dose of Zyprexa  and as needed Geodon  for now

## 2024-04-19 NOTE — Plan of Care (Signed)
  Problem: Clinical Measurements: Goal: Ability to maintain clinical measurements within normal limits will improve Outcome: Progressing Goal: Diagnostic test results will improve Outcome: Progressing Goal: Respiratory complications will improve Outcome: Progressing   

## 2024-04-19 NOTE — Assessment & Plan Note (Signed)
 Leukocytosis and UA concerning for UTI on admission. Urine cultures pending. -Continue with ceftriaxone  -Follow-up urine culture results

## 2024-04-19 NOTE — Hospital Course (Addendum)
 Partly taken from H&P.  Kelly [name] is a 48 y.o. Caucasian female with medical history significant for anxiety, depression and bipolar disorder, GERD, hypertension and dyslipidemia as well as OSA, who presented to the emergency room with acute onset of altered mental status.  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  She has been having hallucinations since. Timing does seem to correlate with initiation of lithium  per daughter.  Patient was found to be hypoglycemic by EMS which improved with intervention.  On presentation borderline soft blood pressure at 85/59, rest of the vital stable.  Blood pressure improved with hydration.  Labs with creatinine of 1.23, ammonia 18, lithium  levels were low at 0.25.  TSH normal at 1.97.  UA positive for UTI and urine cultures were sent.  UDS positive for benzodiazepines. EKG with NSR, low voltage QRS and T wave inversion anteroseptally with Q waves inferiorly. CT head was negative for any acute abnormality.  Patient was started on ceftriaxone  for concern of UTI.  12/29: Vital stable, labs with improving creatinine, leukocytosis resolved, folic acid  low at 3.6-starting on supplement.  B12 at 433, lithium  levels with further decreased to 0.21. Improving mentation, just disoriented to place at this time.  Per patient she takes lithium  for a while. Pending urine cultures. MRI brain was also ordered to rule out any structural abnormality.  12/30: Vital stable, MRI brain was negative for any abnormality and was normal.  Urine cultures with pansensitive E. coli.  Antibiotic switched with Keflex .  MRI brain was completely normal.  Patient continued to have some waxing and waning confusion but able to answer all questions appropriately.  She appears tired and confused at time but can engage in a conversation appropriately.  Patient denies any further hallucinations.  Case was also discussed with our neurologist and there is no neurologic needs.   Patient can  follow-up with outpatient neurology to discuss about the possibility of postconcussion syndrome which is very less likely.  Patient with extensive psych history with bipolar disorder and PTSD listed in her chart.  We discontinued all of her prior home medications, our psychiatrist started her on Zyprexa  5 mg twice daily, she can take clonazepam  only as needed but should avoid it.  Everything was discussed with her daughter and she need to follow-up with outpatient psychiatrist for further assistance.  Patient will get benefit from outpatient cognitive behavioral therapy with the hope that she will able to cope with her current situation.  She will continue the rest of her home medications except her prior psych medications and need to follow-up with her providers closely for further assistance.

## 2024-04-19 NOTE — Progress Notes (Signed)
 No urine output observed thus far during the shift. The patient reports not feeling the urge to void but did attempt to urinate.  A bladder scan was performed, showing a volume of 93 mL at this time.

## 2024-04-19 NOTE — Assessment & Plan Note (Signed)
Blood pressure within goal. -Continue home metoprolol

## 2024-04-19 NOTE — Assessment & Plan Note (Signed)
-   Will continue BuSpar and trazodone

## 2024-04-20 ENCOUNTER — Other Ambulatory Visit: Payer: Self-pay

## 2024-04-20 DIAGNOSIS — N390 Urinary tract infection, site not specified: Secondary | ICD-10-CM

## 2024-04-20 DIAGNOSIS — E162 Hypoglycemia, unspecified: Secondary | ICD-10-CM

## 2024-04-20 DIAGNOSIS — G9341 Metabolic encephalopathy: Secondary | ICD-10-CM | POA: Diagnosis not present

## 2024-04-20 DIAGNOSIS — F419 Anxiety disorder, unspecified: Secondary | ICD-10-CM | POA: Diagnosis not present

## 2024-04-20 LAB — URINE CULTURE: Culture: >=100000 — AB

## 2024-04-20 LAB — SYPHILIS: RPR W/REFLEX TO RPR TITER AND TREPONEMAL ANTIBODIES, TRADITIONAL SCREENING AND DIAGNOSIS ALGORITHM: RPR Ser Ql: NONREACTIVE

## 2024-04-20 MED ORDER — CEPHALEXIN 500 MG PO CAPS
500.0000 mg | ORAL_CAPSULE | Freq: Two times a day (BID) | ORAL | Status: DC
  Administered 2024-04-20: 500 mg via ORAL
  Filled 2024-04-20: qty 1

## 2024-04-20 MED ORDER — OLANZAPINE 5 MG PO TABS
5.0000 mg | ORAL_TABLET | Freq: Two times a day (BID) | ORAL | 0 refills | Status: AC
  Filled 2024-04-20: qty 60, 30d supply, fill #0

## 2024-04-20 MED ORDER — FOLIC ACID 1 MG PO TABS
1.0000 mg | ORAL_TABLET | Freq: Every day | ORAL | 1 refills | Status: AC
  Filled 2024-04-20: qty 90, 90d supply, fill #0

## 2024-04-20 MED ORDER — RAMELTEON 8 MG PO TABS
8.0000 mg | ORAL_TABLET | Freq: Every day | ORAL | 0 refills | Status: AC
  Filled 2024-04-20: qty 15, 30d supply, fill #0

## 2024-04-20 MED ORDER — CEPHALEXIN 500 MG PO CAPS
500.0000 mg | ORAL_CAPSULE | Freq: Two times a day (BID) | ORAL | 0 refills | Status: AC
  Filled 2024-04-20: qty 10, 5d supply, fill #0

## 2024-04-20 NOTE — TOC Transition Note (Signed)
 Transition of Care [facility]) - Discharge Note   Patient Details  Name: Kelly [name] MRN: [id number] Date of Birth: 01-12-1976  Transition of Care [facility]) CM/SW Contact:  Alvaro Louder, LCSW Phone Number: 04/20/2024, 4:15 PM   Clinical Narrative:   LCSWA discussed with patient recommendations of OP rehab, patient was agreeable. LCSWA faxed out OP rehab documentation to start services. The patient reported that she would have family come pick her up at discharge.   TOC signing off         Patient Goals and CMS Choice            Discharge Placement                       Discharge Plan and Services Additional resources added to the After Visit Summary for                                       Social Drivers of Health (SDOH) Interventions SDOH Screenings   Food Insecurity: Patient Unable To Answer (04/18/2024)  Housing: Patient Unable To Answer (04/18/2024)  Transportation Needs: Patient Unable To Answer (04/18/2024)  Utilities: Patient Unable To Answer (04/18/2024)  Tobacco Use: Low Risk (04/18/2024)     Readmission Risk Interventions     No data to display

## 2024-04-20 NOTE — Plan of Care (Signed)

## 2024-04-20 NOTE — Evaluation (Signed)
 Occupational Therapy Evaluation Patient Details Name: Kelly [name] MRN: [id number] DOB: 02-21-1976 Today's Date: 04/20/2024   History of Present Illness   48 year old female who presented with complaints of altered mental status.     Clinical Impressions Pt was seen for OT evaluation this date. Prior to hospital admission, pt reports being fairly indep at home, living with her daughters. She reports driving some and family assists with med mgt. No family present to verify PLOF and pt is questionable historian. Hx TBI earlier this yea per chart. Pt presents with deficits in strength and safety/cognition limiting their ability to perform ADL management at baseline level. Pt currently requires supv/assist for IADL.  Pt would benefit from skilled OT services to address noted impairments and functional limitations (see below for any additional details) in order to maximize safety and independence while minimizing future risk of falls, injury, and readmission. Anticipate the need for outpatient follow up OT services upon acute hospital DC.    If plan is discharge home, recommend the following:   Supervision due to cognitive status;Direct supervision/assist for medications management;Direct supervision/assist for financial management;Assist for transportation     Functional Status Assessment   Patient has had a recent decline in their functional status and demonstrates the ability to make significant improvements in function in a reasonable and predictable amount of time.     Equipment Recommendations   None recommended by OT     Recommendations for Other Services         Precautions/Restrictions   Precautions Precautions: Fall Restrictions Weight Bearing Restrictions Per Provider Order: No     Mobility Bed Mobility Overal bed mobility: Modified Independent                  Transfers Overall transfer level: Modified independent                         Balance Overall balance assessment: No apparent balance deficits (not formally assessed)                                         ADL either performed or assessed with clinical judgement   ADL Overall ADL's : Modified independent;Needs assistance/impaired                                       General ADL Comments: Pt demo's mod indep with toileting, grooming, and dressing tasks. No assist for mobility. Question indep/safety wiht higher level IADL such as meal prep and med mgt     Vision         Perception         Praxis         Pertinent Vitals/Pain Pain Assessment Pain Assessment: No/denies pain     Extremity/Trunk Assessment Upper Extremity Assessment Upper Extremity Assessment: Generalized weakness   Lower Extremity Assessment Lower Extremity Assessment: Generalized weakness       Communication Communication Communication: No apparent difficulties   Cognition Arousal: Alert Behavior During Therapy: Flat affect Cognition: No family/caregiver present to determine baseline             OT - Cognition Comments: Pt alert and oriented, demonstrating questionable safety/awareness of deficits  Following commands: Intact       Cueing  General Comments   Cueing Techniques: Verbal cues  Pt with some instability that was corrected by pt, however likely ue to NBOS when ambulating.   Exercises     Shoulder Instructions      Home Living Family/patient expects to be discharged to:: Private residence Living Arrangements: Children Available Help at Discharge: Family Type of Home: House Home Access: Stairs to enter Secretary/administrator of Steps: 4 Entrance Stairs-Rails: Left Home Layout: One level     Bathroom Shower/Tub: Chief Strategy Officer: Standard     Home Equipment: None          Prior Functioning/Environment Prior Level of Function : Independent/Modified  Independent;Driving;Patient poor historian/Family not available               ADLs Comments: pt reports she and her daughter drive, children assist with med mgt.    OT Problem List: Decreased strength;Decreased safety awareness;Decreased cognition   OT Treatment/Interventions: Self-care/ADL training;Therapeutic activities;Cognitive remediation/compensation;DME and/or AE instruction;Patient/family education      OT Goals(Current goals can be found in the care plan section)   Acute Rehab OT Goals Patient Stated Goal: go home OT Goal Formulation: With patient Time For Goal Achievement: 05/04/24 Potential to Achieve Goals: Good ADL Goals Additional ADL Goal #1: Pt will complete ADL/mobility tasks with supervision requiring NO VC for safety, 5/5 opportunities. Additional ADL Goal #2: Pt will verbalize plan to implement at least 1 learned falls prevention strategy to maximize safety.   OT Frequency:  Min 1X/week    Co-evaluation              AM-PAC OT 6 Clicks Daily Activity     Outcome Measure Help from another person eating meals?: None Help from another person taking care of personal grooming?: None Help from another person toileting, which includes using toliet, bedpan, or urinal?: None Help from another person bathing (including washing, rinsing, drying)?: None Help from another person to put on and taking off regular upper body clothing?: None Help from another person to put on and taking off regular lower body clothing?: None 6 Click Score: 24   End of Session    Activity Tolerance: Patient tolerated treatment well Patient left: in bed;with call bell/phone within reach  OT Visit Diagnosis: Other symptoms and signs involving cognitive function;Muscle weakness (generalized) (M62.81)                Time: 8952-8944 OT Time Calculation (min): 8 min Charges:  OT General Charges $OT Visit: 1 Visit OT Evaluation $OT Eval Low Complexity: 1 Low  Warren SAUNDERS., MPH,  MS, OTR/L ascom [phone] 04/20/2024, 1:39 PM

## 2024-04-20 NOTE — Progress Notes (Signed)
 Patient is being discharged home with family. Patient is independently ambulatory. She is alert with confusion. Medicines have been delivered to bedside and meds found in purse returned. Both IV to removed with catheter and tip intact. Gauze and tape applied to removal sites. Staff to transport patient down to lobby for exit.

## 2024-04-20 NOTE — Discharge Summary (Signed)
 " Physician Discharge Summary   Patient: Kelly [name] MRN: [id number] DOB: February 20, 1976  Admit date:     04/18/2024  Discharge date: 04/20/2024  Discharge Physician: Amaryllis Dare   PCP: Nance Gaskins, Darice BRAVO, PA-C   Recommendations at discharge:  Please obtain CBC and BMP on follow-up Please get periodic EKG to monitor QTc Follow-up with psychiatrist-please consider extensive cognitive behavioral therapy.  We discontinued all prior medications and she is currently on Zyprexa  5 mg twice daily. Follow-up with primary care provider Please make sure that she completed a course of antibiotic for UTI.  Discharge Diagnoses: Principal Problem:   Acute metabolic encephalopathy Active Problems:   UTI (urinary tract infection)   Anxiety and depression   Bipolar disorder (HCC)   Migraine   Hypertension   Hypoglycemia   Hospital Course: Partly taken from H&P.  Kelly [name] is a 48 y.o. Caucasian female with medical history significant for anxiety, depression and bipolar disorder, GERD, hypertension and dyslipidemia as well as OSA, who presented to the emergency room with acute onset of altered mental status.  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  She has been having hallucinations since. Timing does seem to correlate with initiation of lithium  per daughter.  Patient was found to be hypoglycemic by EMS which improved with intervention.  On presentation borderline soft blood pressure at 85/59, rest of the vital stable.  Blood pressure improved with hydration.  Labs with creatinine of 1.23, ammonia 18, lithium  levels were low at 0.25.  TSH normal at 1.97.  UA positive for UTI and urine cultures were sent.  UDS positive for benzodiazepines. EKG with NSR, low voltage QRS and T wave inversion anteroseptally with Q waves inferiorly. CT head was negative for any acute abnormality.  Patient was started on ceftriaxone  for concern of UTI.  12/29: Vital stable, labs with improving  creatinine, leukocytosis resolved, folic acid  low at 3.6-starting on supplement.  B12 at 433, lithium  levels with further decreased to 0.21. Improving mentation, just disoriented to place at this time.  Per patient she takes lithium  for a while. Pending urine cultures. MRI brain was also ordered to rule out any structural abnormality.  12/30: Vital stable, MRI brain was negative for any abnormality and was normal.  Urine cultures with pansensitive E. coli.  Antibiotic switched with Keflex .  MRI brain was completely normal.  Patient continued to have some waxing and waning confusion but able to answer all questions appropriately.  She appears tired and confused at time but can engage in a conversation appropriately.  Patient denies any further hallucinations.  Case was also discussed with our neurologist and there is no neurologic needs.   Patient can follow-up with outpatient neurology to discuss about the possibility of postconcussion syndrome which is very less likely.  Patient with extensive psych history with bipolar disorder and PTSD listed in her chart.  We discontinued all of her prior home medications, our psychiatrist started her on Zyprexa  5 mg twice daily, she can take clonazepam  only as needed but should avoid it.  Everything was discussed with her daughter and she need to follow-up with outpatient psychiatrist for further assistance.  Patient will get benefit from outpatient cognitive behavioral therapy with the hope that she will able to cope with her current situation.  She will continue the rest of her home medications except her prior psych medications and need to follow-up with her providers closely for further assistance.  Assessment and Plan: * Acute metabolic encephalopathy This  is likely related to her acute lower UTI and possibly psychiatric origin. Seems improving, disoriented to place. CT head was negative for any acute abnormality. MRI brain with contrast was  normal. Patient is now at baseline mentation, no hallucinations. Psych made some changes to her medication which are being carried on discharge, prior home medications are being held.  UTI (urinary tract infection) Leukocytosis and UA concerning for UTI on admission.  Urine cultures with pansensitive E. coli, patient received ceftriaxone  while in the hospital and is being discharged on Keflex  to complete the course.  Anxiety and depression - Patient will continue as needed clonazepam , trazodone  and home BuSpar  are being held by our psychiatrist.  Bipolar disorder (HCC) Psych was consulted. - They were holding most of her home psych medications, subtherapeutic lithium  levels. - Continue current dose of Zyprexa  and as needed clonazepam   Migraine - Will continue Nurtec, Topamax  was held  Hypertension Blood pressure within goal. -Continue home metoprolol   Consultants: Psychiatry.  Curbside neurology Procedures performed: None Disposition: Home Diet recommendation:  Regular diet DISCHARGE MEDICATION: Allergies as of 04/20/2024   No Known Allergies      Medication List     PAUSE taking these medications    busPIRone  15 MG tablet Wait to take this until your doctor or other care provider tells you to start again. Commonly known as: BUSPAR  Take 1 tablet (15 mg total) by mouth 2 (two) times daily.   lithium  carbonate 300 MG capsule Wait to take this until your doctor or other care provider tells you to start again. Take 1 capsule (300 mg total) by mouth 2 (two) times daily.   topiramate  200 MG tablet Wait to take this until your doctor or other care provider tells you to start again. Commonly known as: TOPAMAX  Take 1 tablet (200 mg total) by mouth daily.   Vraylar  4.5 MG Caps Wait to take this until your doctor or other care provider tells you to start again. Generic drug: Cariprazine  HCl Take 1 capsule (4.5 mg total) by mouth daily.       STOP taking these  medications    benztropine  1 MG tablet Commonly known as: COGENTIN    gabapentin  300 MG capsule Commonly known as: NEURONTIN    traZODone  50 MG tablet Commonly known as: DESYREL        TAKE these medications    cephALEXin  500 MG capsule Commonly known as: KEFLEX  Take 1 capsule (500 mg total) by mouth every 12 (twelve) hours for 5 days.   clonazePAM  1 MG tablet Commonly known as: KLONOPIN  Take 1 tablet (1 mg total) by mouth daily as needed.   cyclobenzaprine  10 MG tablet Commonly known as: FLEXERIL  Take 1 tablet (10 mg total) by mouth every 8 (eight) hours as needed for muscle spasms (headache).   folic acid  1 MG tablet Commonly known as: FOLVITE  Take 1 tablet (1 mg total) by mouth daily. Start taking on: April 21, 2024   metoprolol  tartrate 25 MG tablet Commonly known as: LOPRESSOR  Take 1 tablet (25 mg total) by mouth daily.   naproxen  500 MG tablet Commonly known as: NAPROSYN  Take 1 tablet (500 mg total) by mouth 2 (two) times daily with a meal. As needed for pain   Nurtec 75 MG Tbdp Generic drug: Rimegepant Sulfate  Take 1 tablet (75 mg total) by mouth every other day.   OLANZapine  5 MG tablet Commonly known as: ZYPREXA  Take 1 tablet (5 mg total) by mouth 2 (two) times daily.   ramelteon  8 MG  tablet Commonly known as: ROZEREM  Take 1 tablet (8 mg total) by mouth at bedtime.        Follow-up Information     Teague Gretta Darice BRAVO, PA-C. Schedule an appointment as soon as possible for a visit in 1 week(s).   Specialty: Obstetrics and Gynecology Contact information: [address] [place] [address] [phone]                Discharge Exam: Fredricka Weights   04/18/24 1505 04/18/24 1636  Weight: 103 kg 101 kg   General.  Obese lady, in no acute distress. Pulmonary.  Lungs clear bilaterally, normal respiratory effort. CV.  Regular rate and rhythm, no JVD, rub or murmur. Abdomen.  Soft, nontender, nondistended, BS positive. CNS.  Alert  and oriented .  No focal neurologic deficit. Extremities.  No edema, pulses intact and symmetrical. Psychiatry.  Judgment and insight appears normal.   Condition at discharge: stable  The results of significant diagnostics from this hospitalization (including imaging, microbiology, ancillary and laboratory) are listed below for reference.   Imaging Studies: MR BRAIN W WO CONTRAST Result Date: 04/20/2024 EXAM: MRI BRAIN WITH AND WITHOUT CONTRAST 04/19/2024 07:51:14 PM TECHNIQUE: Multiplanar multisequence MRI of the head/brain was performed with and without the administration of intravenous contrast. CONTRAST: 10 ml of Gadavist . COMPARISON: CT of the head dated 04/18/2024. CLINICAL HISTORY: Head trauma, abnormal mental status (Age 67-64y). History of anxiety, depression and bipolar disorder, gastroesophageal reflux disease, hypertension and dyslipidemia as well as obstructive sleep apnea, acute onset of altered mental status. Traumatic brain injury earlier this year, having hallucinations. FINDINGS: BRAIN AND VENTRICLES: No acute infarct. No acute intracranial hemorrhage. No mass effect or midline shift. No hydrocephalus. The sella is unremarkable. Normal flow voids. No mass or abnormal enhancement. ORBITS: No acute abnormality. SINUSES: No acute abnormality. BONES AND SOFT TISSUES: Normal bone marrow signal and enhancement. No acute soft tissue abnormality. IMPRESSION: 1. Normal. Electronically signed by: Evalene Coho MD 04/20/2024 05:13 AM EST RP Workstation: [id number]   CT Head Wo Contrast Result Date: 04/18/2024 EXAM: CT HEAD WITHOUT CONTRAST 04/18/2024 12:46:54 PM TECHNIQUE: CT of the head was performed without the administration of intravenous contrast. Automated exposure control, iterative reconstruction, and/or weight based adjustment of the mA/kV was utilized to reduce the radiation dose to as low as reasonably achievable. COMPARISON: Head CT 06/12/2023. CLINICAL HISTORY: 48 year old  female with altered mental status and hypoglycemia. FINDINGS: BRAIN AND VENTRICLES: No acute hemorrhage. No evidence of acute infarct. No hydrocephalus. No extra-axial collection. No mass effect or midline shift. Normal brain volume. Gray white differentiation stable and normal. No suspicious intracranial vascular hyperdensity. ORBITS: No acute abnormality. SINUSES: Paranasal sinuses are clear. SOFT TISSUES AND SKULL: No acute soft tissue abnormality. No skull fracture. Tympanic cavities and mastoids are clear. IMPRESSION: 1. Normal non-contrast head CT. Electronically signed by: Helayne Hurst MD 04/18/2024 12:55 PM EST RP Workstation: [id number]    Microbiology: Results for orders placed or performed during the hospital encounter of 04/18/24  Urine Culture     Status: Abnormal   Collection Time: 04/18/24 12:47 PM   Specimen: Urine, Clean Catch  Result Value Ref Range Status   Specimen Description   Final    URINE, CLEAN CATCH Performed at [facility], [address], [place], [place] [address]    Special Requests   Final    NONE Performed at [facility], [address] [place], [place] [address]    Culture >=100,000 COLONIES/mL ESCHERICHIA COLI (A)  Final   Report Status 04/20/2024 FINAL  Final   Organism ID, Bacteria ESCHERICHIA COLI (A)  Final      Susceptibility   Escherichia coli - MIC*    AMPICILLIN 8 SENSITIVE Sensitive     CEFAZOLIN  (URINE) Value in next row Sensitive      4 SENSITIVEThis is a modified FDA-approved test that has been validated and its performance characteristics determined by the reporting laboratory.  This laboratory is certified under the Clinical Laboratory Improvement Amendments CLIA as qualified to perform high complexity clinical laboratory testing.    CEFEPIME Value in next row Sensitive      4 SENSITIVEThis is a modified FDA-approved test that has been validated and its performance characteristics determined by the reporting laboratory.   This laboratory is certified under the Clinical Laboratory Improvement Amendments CLIA as qualified to perform high complexity clinical laboratory testing.    ERTAPENEM Value in next row Sensitive      4 SENSITIVEThis is a modified FDA-approved test that has been validated and its performance characteristics determined by the reporting laboratory.  This laboratory is certified under the Clinical Laboratory Improvement Amendments CLIA as qualified to perform high complexity clinical laboratory testing.    CEFTRIAXONE  Value in next row Sensitive      4 SENSITIVEThis is a modified FDA-approved test that has been validated and its performance characteristics determined by the reporting laboratory.  This laboratory is certified under the Clinical Laboratory Improvement Amendments CLIA as qualified to perform high complexity clinical laboratory testing.    CIPROFLOXACIN Value in next row Sensitive      4 SENSITIVEThis is a modified FDA-approved test that has been validated and its performance characteristics determined by the reporting laboratory.  This laboratory is certified under the Clinical Laboratory Improvement Amendments CLIA as qualified to perform high complexity clinical laboratory testing.    GENTAMICIN Value in next row Sensitive      4 SENSITIVEThis is a modified FDA-approved test that has been validated and its performance characteristics determined by the reporting laboratory.  This laboratory is certified under the Clinical Laboratory Improvement Amendments CLIA as qualified to perform high complexity clinical laboratory testing.    NITROFURANTOIN Value in next row Sensitive      4 SENSITIVEThis is a modified FDA-approved test that has been validated and its performance characteristics determined by the reporting laboratory.  This laboratory is certified under the Clinical Laboratory Improvement Amendments CLIA as qualified to perform high complexity clinical laboratory testing.     TRIMETH/SULFA Value in next row Sensitive      4 SENSITIVEThis is a modified FDA-approved test that has been validated and its performance characteristics determined by the reporting laboratory.  This laboratory is certified under the Clinical Laboratory Improvement Amendments CLIA as qualified to perform high complexity clinical laboratory testing.    AMPICILLIN/SULBACTAM Value in next row Sensitive      4 SENSITIVEThis is a modified FDA-approved test that has been validated and its performance characteristics determined by the reporting laboratory.  This laboratory is certified under the Clinical Laboratory Improvement Amendments CLIA as qualified to perform high complexity clinical laboratory testing.    PIP/TAZO Value in next row Sensitive      <=4 SENSITIVEThis is a modified FDA-approved test that has been validated and its performance characteristics determined by the reporting laboratory.  This laboratory is certified under the Clinical Laboratory Improvement Amendments CLIA as qualified to perform high complexity clinical laboratory testing.    MEROPENEM Value in next row  Sensitive      <=4 SENSITIVEThis is a modified FDA-approved test that has been validated and its performance characteristics determined by the reporting laboratory.  This laboratory is certified under the Clinical Laboratory Improvement Amendments CLIA as qualified to perform high complexity clinical laboratory testing.    * >=100,000 COLONIES/mL ESCHERICHIA COLI    Labs: CBC: Recent Labs  Lab 04/18/24 1218 04/19/24 0910  WBC 9.6 6.6  HGB 14.4 12.9  HCT 45.9 40.3  MCV 93.1 91.6  PLT 257 267   Basic Metabolic Panel: Recent Labs  Lab 04/18/24 1218 04/19/24 0910  NA 140 141  K 3.7 3.6  CL 106 110  CO2 22 21*  GLUCOSE 92 92  BUN 11 10  CREATININE 1.23* 1.02*  CALCIUM  9.6 8.8*   Liver Function Tests: Recent Labs  Lab 04/18/24 1218  AST 14*  ALT 10  ALKPHOS 89  BILITOT 0.3  PROT 7.4  ALBUMIN 4.0    CBG: No results for input(s): GLUCAP in the last 168 hours.  Discharge time spent: greater than 30 minutes.  This record has been created using Conservation officer, historic buildings. Errors have been sought and corrected,but may not always be located. Such creation errors do not reflect on the standard of care.   Signed: Amaryllis Dare, MD [facility] 04/20/2024 "

## 2024-04-20 NOTE — TOC CM/SW Note (Signed)
 [facility] CM/SW Note    Occupational Therapy * Physical Therapy * Speech Therapy  DATE 04/20/24 PATIENT NAME Kelly [name]  PATIENT MRN  [id number]   DIAGNOSIS/DIAGNOSIS CODE  G93.41  DATE OF DISCHARGE 04/20/2024  PRIMARY CARE PHYSICIAN Darice Arturo Gaskins PCP [facility] [phone]   Dear Provider    I certify that I have examined this patient and that occupational/physical/speech therapy is necessary on an outpatient basis.    The patient has expressed interest in completing their recommended course of therapy at your location.  Once a formal order from the patient's primary care physician has been obtained, please contact him/her to schedule an appointment for evaluation at your earliest convenience.  [ x ]  Physical Therapy Evaluate and Treat  [ x ]  Occupational Therapy Evaluate and Treat  [  ]  Speech Therapy Evaluate and Treat  The patient's primary care physician (listed above) must furnish and be responsible for a formal order such that the recommended services may be furnished while under the primary physician's care, and that the plan of care will be established and reviewed every 30 days (or more often if condition necessitates).

## 2024-04-20 NOTE — Consult Note (Signed)
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 judgment  fair Estimated Level of intellectual functioning-average. Estimated level of functioning-average.   Physical Exam: Physical Exam ROS Blood pressure 97/70, pulse 70, temperature 97.7 F (36.5 C), resp. rate 16, height 5' 7 (1.702 m), weight 101 kg, last menstrual period 06/05/2022, SpO2 100%. Body mass index is 34.87 kg/m.  Treatment Plan Summary:  A/P Delirium    Continue medical workup for underlying etiology of delirium Post-concussion syndrome (PCS) ?? Unintentional medication OD 2/2 ongoing confusion ?? Possible contribution from UTI    Visual Hallucination - in episodes after concussion  PTSD  even before TBI  H/o Depression even before TBI  H/o anxiety even before TBI    PLAN:  For acute agitation- Can continue Geodon  as ordered by primary team.   -- Delirium Management   1) Non-Pharmacological Measures: Please ensure blinds are open during day to allow sufficient daylight to enter room; maintain dark/quiet room at night with minimal interruptions; minimize daytime naps and maintain sleep/wake cycle as able; if patient wears glasses or hearing aids, patient should have access  to them as sensory deprivation can cause/worsen delirium; minimize room/staff changes; encourage interaction with familiar objects and frequent orientation.    2) Pharmacological Measures:  Minimize use of benzodiazepines, anticholingeric medications and opiates as these can all exacerbate delirium.  Continue Zyprexa  5 mg BID.  Schedule Remelteon 8 mg (@[place] ), to target sleep and circadian rhythm  Insomnia, secondary to environmental changes   Disposition: Patient does not meet criteria for psychiatric inpatient admission. Supportive therapy provided about ongoing stressors.  Zelda Sharps, NP This note was created using Dragon dictation software. Please excuse any inadvertent transcription errors. Case was discussed with supervising physician Dr. Hellen who is agreeable with current plan.        [1] No Known Allergies

## 2024-04-20 NOTE — Evaluation (Signed)
 Physical Therapy Evaluation Patient Details Name: Kelly [name] MRN: [id number] DOB: Jun 19, 1975 Today's Date: 04/20/2024  History of Present Illness  48 year old female who presented with complaints of altered mental status.  Clinical Impression  Pt received in Semi-Fowler's position and agreeable to therapy.  Pt able to perform bed mobility without any assistance and is able to come upright into standing without physical assistance as well.  Pt seems cognitively aware of situation, however noted to have some incoherent speech at times, but is able to correct.  Pt then ambulated to the therapy gym and was able to participate in stair training without any difficulty other than needing verbal cuing for only utilizing the handrail on the L ascending and R descending to simulate home set up.  Pt ambulates well with stairs and demonstrates adequate safety to perform at home.  Pt returned to room and she noted she wanted to get dressed.  Pt and therapist discussed potential for outpatient therapy to build strength back up, along with having speech evaluation.  Pt agreeable to this recommendation.        If plan is discharge home, recommend the following: A little help with walking and/or transfers;A little help with bathing/dressing/bathroom;Assistance with cooking/housework;Help with stairs or ramp for entrance   Can travel by private vehicle        Equipment Recommendations None recommended by PT  Recommendations for Other Services       Functional Status Assessment Patient has had a recent decline in their functional status and demonstrates the ability to make significant improvements in function in a reasonable and predictable amount of time.     Precautions / Restrictions Restrictions Weight Bearing Restrictions Per Provider Order: No      Mobility  Bed Mobility Overal bed mobility: Modified Independent                  Transfers Overall transfer level: Modified  independent                      Ambulation/Gait Ambulation/Gait assistance: Contact guard assist, Supervision Gait Distance (Feet): 200 Feet Assistive device: None Gait Pattern/deviations: Step-through pattern, Staggering left, Staggering right, Drifts right/left, Narrow base of support Gait velocity: decreased     General Gait Details: Pt at times staggered in each direction likely due to the NBOS she ambulates with.  Stairs Stairs: Yes Stairs assistance: Contact guard assist Stair Management: One rail Left, Alternating pattern, Forwards Number of Stairs: 4 General stair comments: Pt able to perform stair training without any complications, however did need some verbal cues to refrain from utilizing the other railing in order to properly simulate home setup.  Wheelchair Mobility     Tilt Bed    Modified Rankin (Stroke Patients Only)       Balance Overall balance assessment: Mild deficits observed, not formally tested                                           Pertinent Vitals/Pain Pain Assessment Pain Assessment: No/denies pain    Home Living Family/patient expects to be discharged to:: Private residence Living Arrangements: Children Available Help at Discharge: Family Type of Home: House Home Access: Stairs to enter Entrance Stairs-Rails: Left Entrance Stairs-Number of Steps: 4   Home Layout: One level Home Equipment: None      Prior Function Prior Level of Function :  Independent/Modified Independent                     Extremity/Trunk Assessment   Upper Extremity Assessment Upper Extremity Assessment: Generalized weakness    Lower Extremity Assessment Lower Extremity Assessment: Generalized weakness       Communication   Communication Communication: No apparent difficulties    Cognition Arousal: Alert Behavior During Therapy: WFL for tasks assessed/performed   PT - Cognitive impairments: History of  cognitive impairments, No family/caregiver present to determine baseline                       PT - Cognition Comments: Pt able to state person, place, and time when asked.  Pt off by one day when asking for the date, but overall seemed present and cognitively aware. Following commands: Intact       Cueing       General Comments General comments (skin integrity, edema, etc.): Pt with some instability that was corrected by pt, however likely ue to NBOS when ambulating.    Exercises     Assessment/Plan    PT Assessment Patient needs continued PT services  PT Problem List Decreased strength;Decreased balance;Decreased safety awareness       PT Treatment Interventions DME instruction;Gait training;Stair training;Functional mobility training;Therapeutic activities;Therapeutic exercise;Balance training;Neuromuscular re-education    PT Goals (Current goals can be found in the Care Plan section)  Acute Rehab PT Goals Patient Stated Goal: to return home. PT Goal Formulation: With patient Time For Goal Achievement: 05/04/24 Potential to Achieve Goals: Good    Frequency Min 2X/week     Co-evaluation               AM-PAC PT 6 Clicks Mobility  Outcome Measure Help needed turning from your back to your side while in a flat bed without using bedrails?: None Help needed moving from lying on your back to sitting on the side of a flat bed without using bedrails?: None Help needed moving to and from a bed to a chair (including a wheelchair)?: None Help needed standing up from a chair using your arms (e.g., wheelchair or bedside chair)?: A Little Help needed to walk in hospital room?: A Little Help needed climbing 3-5 steps with a railing? : A Little 6 Click Score: 21    End of Session Equipment Utilized During Treatment: Gait belt Activity Tolerance: Patient tolerated treatment well Patient left:  (pt left in standing while self-reporting that she was wanting to get  dressed.)   PT Visit Diagnosis: Other abnormalities of gait and mobility (R26.89);Muscle weakness (generalized) (M62.81);Difficulty in walking, not elsewhere classified (R26.2)    Time: 1025-1039 PT Time Calculation (min) (ACUTE ONLY): 14 min   Charges:   PT Evaluation $PT Eval Low Complexity: 1 Low   PT General Charges $$ ACUTE PT VISIT: 1 Visit         Fonda Simpers, PT, DPT Physical Therapist - [place]  [facility]  04/20/2024, 12:40 PM

## 2024-04-20 NOTE — Progress Notes (Signed)
 Discharge paperwork given to daughter. Both were educated on current medications usage with acknowledgement and acceptance.

## 2024-04-20 NOTE — Plan of Care (Signed)
" °  Problem: Safety: Goal: Ability to remain free from injury will improve Outcome: Progressing   Problem: Elimination: Goal: Will not experience complications related to bowel motility Outcome: Progressing   Problem: Coping: Goal: Level of anxiety will decrease Outcome: Progressing   Problem: Nutrition: Goal: Adequate nutrition will be maintained Outcome: Progressing   Problem: Health Behavior/Discharge Planning: Goal: Ability to manage health-related needs will improve Outcome: Progressing   "

## 2024-04-23 ENCOUNTER — Other Ambulatory Visit: Payer: Self-pay | Admitting: Physician Assistant

## 2024-05-27 ENCOUNTER — Encounter: Payer: Self-pay | Admitting: Family Medicine

## 2024-05-27 ENCOUNTER — Ambulatory Visit: Admitting: Family Medicine

## 2024-05-27 VITALS — BP 117/80 | HR 112 | Ht 67.0 in | Wt 249.0 lb

## 2024-05-27 DIAGNOSIS — Z01419 Encounter for gynecological examination (general) (routine) without abnormal findings: Secondary | ICD-10-CM | POA: Diagnosis not present

## 2024-05-27 DIAGNOSIS — Z1231 Encounter for screening mammogram for malignant neoplasm of breast: Secondary | ICD-10-CM

## 2024-05-27 DIAGNOSIS — Z1211 Encounter for screening for malignant neoplasm of colon: Secondary | ICD-10-CM

## 2024-05-27 DIAGNOSIS — Z23 Encounter for immunization: Secondary | ICD-10-CM | POA: Diagnosis not present

## 2024-05-27 DIAGNOSIS — Z87440 Personal history of urinary (tract) infections: Secondary | ICD-10-CM | POA: Diagnosis not present

## 2024-05-27 LAB — POCT URINALYSIS DIPSTICK
Bilirubin, UA: NEGATIVE
Blood, UA: POSITIVE
Glucose, UA: NEGATIVE
Ketones, UA: NEGATIVE
Leukocytes, UA: NEGATIVE
Nitrite, UA: NEGATIVE
Protein, UA: NEGATIVE
Spec Grav, UA: 1.01
Urobilinogen, UA: 1 U/dL
pH, UA: 6.5

## 2024-05-27 NOTE — Progress Notes (Signed)
 Subjective:     Kelly [name] is a 49 y.o. female and is here for a comprehensive physical exam. The patient reports no problems.      The following portions of the patient's history were reviewed and updated as appropriate: allergies, current medications, past family history, past medical history, past social history, past surgical history, and problem list.  Review of Systems Pertinent items noted in HPI and remainder of comprehensive ROS otherwise negative.   Objective:  Chaperone present for exam   BP 117/80   Pulse (!) 112   Ht 5' 7 (1.702 m)   Wt 249 lb (112.9 kg)   LMP 06/05/2022   BMI 39.00 kg/m  General appearance: alert, cooperative, and appears stated age Head: Normocephalic, without obvious abnormality, atraumatic Neck: no adenopathy, supple, symmetrical, trachea midline, and thyroid  not enlarged, symmetric, no tenderness/mass/nodules Lungs: clear to auscultation bilaterally Breasts: normal appearance, no masses or tenderness Heart: regular rate and rhythm, S1, S2 normal, no murmur, click, rub or gallop Abdomen: soft, non-tender; bowel sounds normal; no masses,  no organomegaly Extremities: extremities normal, atraumatic, no cyanosis or edema Pulses: 2+ and symmetric Skin: Skin color, texture, turgor normal. No rashes or lesions Lymph nodes: Cervical, supraclavicular, and axillary nodes normal. Neurologic: Grossly normal     Assessment:    Healthy female exam.      Plan:  History of UTI - U/a with blood, will send for culture. - Plan: POCT Urinalysis Dipstick, Urine Culture  Encounter for gynecological examination without abnormal finding - has PCP for labs.  Screen for colon cancer - Agreed to cologuard only - Plan: Cologuard  Encounter for screening mammogram for malignant neoplasm of breast - Plan: MM 3D SCREENING MAMMOGRAM BILATERAL BREAST  Encounter for immunization - flu shot today - Plan: Flu vaccine trivalent PF, 6mos and  older(Flulaval,Afluria,Fluarix,Fluzone)     See After Visit Summary for Counseling Recommendations

## 2024-05-28 LAB — URINE CULTURE
